# Patient Record
Sex: Female | Born: 1950 | Race: White | Hispanic: No | Marital: Married
Health system: Southern US, Community
[De-identification: ages and names within clinical notes are randomized; demographics above are authoritative.]

## PROBLEM LIST (undated history)

## (undated) DIAGNOSIS — C787 Secondary malignant neoplasm of liver and intrahepatic bile duct: Secondary | ICD-10-CM

## (undated) DIAGNOSIS — C7800 Secondary malignant neoplasm of unspecified lung: Secondary | ICD-10-CM

## (undated) DIAGNOSIS — N63 Unspecified lump in unspecified breast: Secondary | ICD-10-CM

## (undated) DIAGNOSIS — C801 Malignant (primary) neoplasm, unspecified: Secondary | ICD-10-CM

## (undated) DIAGNOSIS — F419 Anxiety disorder, unspecified: Secondary | ICD-10-CM

## (undated) DIAGNOSIS — C7931 Secondary malignant neoplasm of brain: Secondary | ICD-10-CM

---

## 1998-07-10 ENCOUNTER — Other Ambulatory Visit: Admission: RE | Admit: 1998-07-10 | Discharge: 1998-07-10 | Payer: Self-pay | Admitting: *Deleted

## 1999-07-11 ENCOUNTER — Other Ambulatory Visit: Admission: RE | Admit: 1999-07-11 | Discharge: 1999-07-11 | Payer: Self-pay | Admitting: *Deleted

## 2000-05-25 ENCOUNTER — Other Ambulatory Visit: Admission: RE | Admit: 2000-05-25 | Discharge: 2000-05-25 | Payer: Self-pay | Admitting: *Deleted

## 2001-02-01 ENCOUNTER — Other Ambulatory Visit: Admission: RE | Admit: 2001-02-01 | Discharge: 2001-02-01 | Payer: Self-pay | Admitting: Dermatology

## 2001-04-20 ENCOUNTER — Other Ambulatory Visit: Admission: RE | Admit: 2001-04-20 | Discharge: 2001-04-20 | Payer: Self-pay | Admitting: *Deleted

## 2002-01-10 ENCOUNTER — Encounter: Payer: Self-pay | Admitting: Family Medicine

## 2002-01-10 ENCOUNTER — Ambulatory Visit (HOSPITAL_COMMUNITY): Admission: RE | Admit: 2002-01-10 | Discharge: 2002-01-10 | Payer: Self-pay | Admitting: Internal Medicine

## 2002-02-01 ENCOUNTER — Other Ambulatory Visit: Admission: RE | Admit: 2002-02-01 | Discharge: 2002-02-01 | Payer: Self-pay | Admitting: Gynecology

## 2002-06-28 ENCOUNTER — Ambulatory Visit (HOSPITAL_COMMUNITY): Admission: RE | Admit: 2002-06-28 | Discharge: 2002-06-28 | Payer: Self-pay | Admitting: Family Medicine

## 2002-06-28 ENCOUNTER — Encounter: Payer: Self-pay | Admitting: Family Medicine

## 2003-03-22 ENCOUNTER — Other Ambulatory Visit: Admission: RE | Admit: 2003-03-22 | Discharge: 2003-03-22 | Payer: Self-pay | Admitting: Gynecology

## 2003-12-18 ENCOUNTER — Ambulatory Visit (HOSPITAL_COMMUNITY): Admission: RE | Admit: 2003-12-18 | Discharge: 2003-12-18 | Payer: Self-pay | Admitting: Internal Medicine

## 2004-03-22 ENCOUNTER — Other Ambulatory Visit: Admission: RE | Admit: 2004-03-22 | Discharge: 2004-03-22 | Payer: Self-pay | Admitting: Gynecology

## 2004-06-10 ENCOUNTER — Ambulatory Visit (HOSPITAL_COMMUNITY): Admission: RE | Admit: 2004-06-10 | Discharge: 2004-06-10 | Payer: Self-pay | Admitting: Family Medicine

## 2005-04-15 ENCOUNTER — Other Ambulatory Visit: Admission: RE | Admit: 2005-04-15 | Discharge: 2005-04-15 | Payer: Self-pay | Admitting: Gynecology

## 2005-06-05 ENCOUNTER — Ambulatory Visit: Payer: Self-pay | Admitting: Internal Medicine

## 2005-06-11 ENCOUNTER — Ambulatory Visit: Payer: Self-pay | Admitting: Internal Medicine

## 2005-06-11 ENCOUNTER — Ambulatory Visit (HOSPITAL_COMMUNITY): Admission: RE | Admit: 2005-06-11 | Discharge: 2005-06-11 | Payer: Self-pay | Admitting: Internal Medicine

## 2005-07-24 ENCOUNTER — Ambulatory Visit: Payer: Self-pay | Admitting: Internal Medicine

## 2005-08-01 ENCOUNTER — Ambulatory Visit (HOSPITAL_COMMUNITY): Admission: RE | Admit: 2005-08-01 | Discharge: 2005-08-01 | Payer: Self-pay | Admitting: Family Medicine

## 2005-12-16 ENCOUNTER — Ambulatory Visit: Payer: Self-pay | Admitting: Internal Medicine

## 2005-12-16 ENCOUNTER — Ambulatory Visit (HOSPITAL_COMMUNITY): Admission: RE | Admit: 2005-12-16 | Discharge: 2005-12-16 | Payer: Self-pay | Admitting: Internal Medicine

## 2006-05-13 ENCOUNTER — Other Ambulatory Visit: Admission: RE | Admit: 2006-05-13 | Discharge: 2006-05-13 | Payer: Self-pay | Admitting: Gynecology

## 2007-06-09 ENCOUNTER — Other Ambulatory Visit: Admission: RE | Admit: 2007-06-09 | Discharge: 2007-06-09 | Payer: Self-pay | Admitting: Gynecology

## 2009-01-16 ENCOUNTER — Encounter (INDEPENDENT_AMBULATORY_CARE_PROVIDER_SITE_OTHER): Payer: Self-pay | Admitting: Gynecology

## 2009-01-16 ENCOUNTER — Ambulatory Visit (HOSPITAL_BASED_OUTPATIENT_CLINIC_OR_DEPARTMENT_OTHER): Admission: RE | Admit: 2009-01-16 | Discharge: 2009-01-16 | Payer: Self-pay | Admitting: Gynecology

## 2009-02-06 ENCOUNTER — Encounter: Payer: Self-pay | Admitting: Gastroenterology

## 2010-06-20 ENCOUNTER — Ambulatory Visit (HOSPITAL_BASED_OUTPATIENT_CLINIC_OR_DEPARTMENT_OTHER)
Admission: RE | Admit: 2010-06-20 | Discharge: 2010-06-20 | Payer: Self-pay | Source: Home / Self Care | Admitting: Gynecology

## 2010-12-02 ENCOUNTER — Encounter: Payer: Self-pay | Admitting: Gastroenterology

## 2010-12-02 ENCOUNTER — Ambulatory Visit (INDEPENDENT_AMBULATORY_CARE_PROVIDER_SITE_OTHER): Payer: PRIVATE HEALTH INSURANCE | Admitting: Gastroenterology

## 2010-12-02 DIAGNOSIS — K589 Irritable bowel syndrome without diarrhea: Secondary | ICD-10-CM | POA: Insufficient documentation

## 2010-12-10 NOTE — Assessment & Plan Note (Signed)
Summary: PT WANTS REPEAT TCS/HAS HAD A HX OF CA IN HER VULVA SINCE HER...   Vital Signs:  Patient profile:   60 year old female Height:      61 inches Weight:      150 pounds BMI:     28.44 Temp:     98.5 degrees F oral Pulse rate:   72 / minute BP sitting:   132 / 86  (left arm)  Vitals Entered By: Carolan Clines LPN (December 02, 5407 1:29 PM)  Visit Type:  Initial Visit Primary Care Telly Broberg:  Dr. Collins Scotland   History of Present Illness: Pt presents today for possible colonoscopy. Last colonoscopy in March 2005 by Dr. Marlowe Alt canal hemorrhoids. FH of colon ca: uncle, diagnosed age 52. 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 Seen at [facility] for treatment; underwent surgery Oct 2011. Did not require chemo or radiation. Here today concerned and wants to have sooner colonoscopy. Due for repeat in 2015. Has hx of IBS-D; took align a few years ago, which kept her regular. After this diagnosis, has been extremely stressed. Has gotten back on align, now less postprandial urgency and loose stools. Consistency is improving. +brbpr for past few months intermittently. No loss of appetite.    Current Medications (verified): 1)  Pantoprazole Sodium 40 Mg Tbec (Pantoprazole Sodium) .... Take One Once Daily 2)  Vytorin 10-80 Mg Tabs (Ezetimibe-Simvastatin) .... 1/2 Tab Once Daily 3)  Diovan 80 Mg Tabs (Valsartan) .... Take One Once Daily 4)  Celexa 20 Mg Tabs (Citalopram Hydrobromide) .... Take One Half Once Daily 5)  Vivelle-Dot 0.1 Mg/24hr Pttw (Estradiol) .... Take 1/2 Twice Per Week 6)  Prometrium 200 Mg Caps (Progesterone Micronized) .... Take 12 Days Every Three Months 7)  Vitamin D3 400 Unit Tabs (Cholecalciferol) .... Take Two Once Daily 8)  Fish Oil 1000 Mg Caps (Omega-3 Fatty Acids) .... Take Two Once Daily  Allergies (verified): No Known Drug Allergies  Past History:  Past Medical History: Vulva melanoma HTN GERD  Past Surgical History: vulva melanoma  removal cholecystectomy 30 years ago tubal ligation   Family History: Mother: living age 47, HTN Father: deceased, HTN, Alzheimer's, CLL Siblings: 2 brothers: HTN, IBS Paternal uncle: colon ca in 36s, deceased  Social History: Occupation: [employer] manager. Patient currently smokes. 1/2 ppd X 30 years Alcohol Use - no Illicit Drug Use - no Smoking Status:  current Drug Use:  no  Review of Systems General:  Denies fever, chills, and anorexia. Eyes:  Denies blurring, irritation, and discharge. ENT:  Denies sore throat, hoarseness, and difficulty swallowing. CV:  Denies chest pains and syncope. Resp:  Denies dyspnea at rest and wheezing. GI:  See HPI. GU:  Denies urinary burning and urinary frequency. MS:  Denies joint pain / LOM, joint swelling, and joint stiffness. Derm:  Denies rash, itching, and dry skin. Neuro:  Denies weakness and syncope. Psych:  Denies depression and anxiety. Endo:  Denies cold intolerance and heat intolerance.  Physical Exam  General:  Well developed, well nourished, no acute distress. Head:  Normocephalic and atraumatic. Eyes:  PERRLA, no icterus. Lungs:  Clear throughout to auscultation. Heart:  Regular rate and rhythm; no murmurs, rubs,  or bruits. Abdomen:  +BS, soft, non-tender, non-distended. no HSM. No rebound or guarding.  Msk:  Symmetrical with no gross deformities. Normal posture. Pulses:  Normal pulses noted. Extremities:  No clubbing, cyanosis, edema or deformities noted. Neurologic:  Alert and  oriented x4;  grossly normal neurologically. Skin:  Intact without significant lesions or rashes. Psych:  Alert and cooperative. Normal mood and affect.   Impression & Recommendations:  Problem # 1:  IRRITABLE BOWEL SYNDROME (ICD-13.76)  60 year old pleasant Caucasian female with hx of IBS, diarrhea predominant. Recently diagnosed in Sept 2011 with vulva melanoma, s/p removal in October 2011. Has +FH colon ca (paternal  uncle in 31s). Stool consistency improving since restarting Align; has done well with this in the past. Under a lot of stress secondary to diagnosis. +brbpr intermittently for past few months. Although next colonoscopy due in 2015, would like sooner appt due to recent diagnosis. Most likely low-volume hematochezia secondary to benign anorectal source; diagnosis of IBS likely contributor to bowel habits.  TCS with Dr. Jena Gauss in near future: the R/B/A have been discussed in detail; pt states understanding and desires to proceed. Continue Align  Orders: New Patient Level III [phone])   Orders Added: 1)  New Patient Level III [98119]

## 2010-12-12 LAB — POCT I-STAT 4, (NA,K, GLUC, HGB,HCT)
Glucose, Bld: 104 mg/dL — ABNORMAL HIGH (ref 70–99)
HCT: 42 % (ref 36.0–46.0)
Hemoglobin: 14.3 g/dL (ref 12.0–15.0)
Potassium: 4 mEq/L (ref 3.5–5.1)
Sodium: 138 mEq/L (ref 135–145)

## 2010-12-23 ENCOUNTER — Encounter: Payer: PRIVATE HEALTH INSURANCE | Admitting: Internal Medicine

## 2010-12-23 ENCOUNTER — Ambulatory Visit (HOSPITAL_COMMUNITY)
Admission: RE | Admit: 2010-12-23 | Discharge: 2010-12-23 | Disposition: A | Discharge status: Home / Self Care | Payer: PRIVATE HEALTH INSURANCE | Source: Ambulatory Visit | Attending: Internal Medicine | Admitting: Internal Medicine

## 2010-12-23 DIAGNOSIS — K648 Other hemorrhoids: Secondary | ICD-10-CM

## 2010-12-23 DIAGNOSIS — E785 Hyperlipidemia, unspecified: Secondary | ICD-10-CM | POA: Insufficient documentation

## 2010-12-23 DIAGNOSIS — I10 Essential (primary) hypertension: Secondary | ICD-10-CM | POA: Insufficient documentation

## 2010-12-23 DIAGNOSIS — K625 Hemorrhage of anus and rectum: Secondary | ICD-10-CM

## 2010-12-23 DIAGNOSIS — Z79899 Other long term (current) drug therapy: Secondary | ICD-10-CM | POA: Insufficient documentation

## 2010-12-24 NOTE — Op Note (Signed)
  Patricia [name], Patricia [name]              ACCOUNT NO.:  [id number]  MEDICAL RECORD NO.:  [id number]           PATIENT TYPE:  O  LOCATION:  DAYP                          FACILITY:  [facility]  PHYSICIAN:  R. Roetta Sessions, M.D. DATE OF BIRTH:  12-28-1950  DATE OF PROCEDURE:  12/23/2010 DATE OF DISCHARGE:                              OPERATIVE REPORT   Ileocolonoscopy diagnostic.  INDICATIONS FOR PROCEDURE:  A 60 year old lady with couple of episodes of blood per rectum recently, history of a bulbar melanoma, last colonoscopy in 2005.  No family history any first-degree relatives with colon cancer.  Colonoscopy is now being done.  Risks, benefits, limitations, alternatives, and imponderables have been discussed, questions answered.  Please see the documentation for medical record.  PROCEDURE NOTE:  O2 saturation, blood pressure, pulse, and respirations were monitored throughout the entire procedure.  CONSCIOUS SEDATION:  Versed 5 mg IV, Demerol 125 mg IV in divided doses.  INSTRUMENT:  Pentax video chip system.  FINDINGS:  Digital rectal exam revealed no abnormalities.  Endoscopic findings, prep was adequate.  Colon:  Colonic mucosa was surveyed from the rectosigmoid junction through the left transverse, right colon, to the appendiceal orifice, ileocecal valve/cecum.  These structures were well seen and photographed for the record.  Terminal ileum was admitted to 10 cm.  From this level scope was slowly and cautiously withdrawn. All previously mentioned mucosal surfaces were again seen.  The colonic mucosa appeared normal as did the terminal ileum mucosa.  Scope was pulled down into the rectum with thorough examination of the rectal mucosa including retroflex view of the anal verge demonstrating anal papilla only.  The patient tolerated the procedure well.  Cecal withdrawal time  7 minutes.  IMPRESSION:  Internal hemorrhoids, anal papilla, otherwise normal rectum, colon, terminal ileum.   I suspect benign anorectal bleeding.  RECOMMENDATIONS: 1. Hemorrhoid literature provided to Ms. Lequita Halt.  Continue Align     probiotic 1 capsule daily. 2. Anusol-HC suppositories 1 per rectum at bedtime x10 days. 3. Family history of colon cancer in an uncle but in advanced age.     This is secondary relative colon cancer advance age which has not     put her on a high risk category.  RECOMMENDATIONS:  Consider repeat screening colonoscopy in 10 years.     Jonathon Bellows, M.D.     RMR/MEDQ  D:  12/23/2010  T:  12/23/2010  Job:  [id number]  cc:   De Blanch, M.D.  Dr. Collins Scotland [facility]  Electronically Signed by Lorrin Goodell M.D. on 12/24/2010 11:26:04 AM

## 2011-01-08 LAB — POCT I-STAT, CHEM 8
BUN: 14 mg/dL (ref 6–23)
Calcium, Ion: 1.19 mmol/L (ref 1.12–1.32)
Chloride: 104 mEq/L (ref 96–112)
Creatinine, Ser: 0.5 mg/dL (ref 0.4–1.2)
Glucose, Bld: 108 mg/dL — ABNORMAL HIGH (ref 70–99)
HCT: 43 % (ref 36.0–46.0)
Hemoglobin: 14.6 g/dL (ref 12.0–15.0)
Potassium: 4 mEq/L (ref 3.5–5.1)
Sodium: 140 mEq/L (ref 135–145)
TCO2: 24 mmol/L (ref 0–100)

## 2011-02-11 NOTE — Op Note (Signed)
Patricia [name], Patricia [name] NO.:  [id number]   MEDICAL RECORD NO.:  [id number]          PATIENT TYPE:  AMB   LOCATION:  NESC                         FACILITY:  [facility]   PHYSICIAN:  Gretta Cool, M.D. DATE OF BIRTH:  [birth year]   DATE OF PROCEDURE:  01/16/2009  DATE OF DISCHARGE:                               OPERATIVE REPORT   PREOPERATIVE DIAGNOSIS:  Abnormal uterine bleeding, recurrent with  history of dilatation and curettage x3, unresponsive to conservative  therapy.   PROCEDURES:  1. Hysteroscopy.  2. Resection of her endometrium.  3. Total resection of endometrial polyps and ablation with VaporTrode.   SURGEON:  Gretta Cool, M.D.   ANESTHESIA:  IV sedation and paracervical block.   DESCRIPTION OF PROCEDURE:  Under excellent anesthesia as above, with the  patient prepped and draped in Allen stirrups, with her bladder drained.  The cervix was grasped with a single-tooth tenaculum, pulled down into  view.  It was progressively dilated with a series of Pratt dilators to  accommodate the 7 mm resectoscope.  The cavity was photographed with  evidence of endometrial polyps.  The polyps were resected first the  entire endometrium was then resected and submitted for pathologic  examination.  At this point the VaporTrode electrode was applied and the  entire cavity treated 360 degrees with VaporTrode to eliminate any  superficial endometrium.  The cornual areas were treated by touch  technique.  At this point the procedure was terminated without  complication.  The patient returned to the recovery room in excellent  condition.           ______________________________  Gretta Cool, M.D.     CWL/MEDQ  D:  01/16/2009  T:  01/16/2009  Job:  [id number]   cc:   Dr. Yehuda Budd

## 2011-02-14 NOTE — Op Note (Signed)
[name], [name]              ACCOUNT NO.:  [id number]   MEDICAL RECORD NO.:  [id number]          PATIENT TYPE:  AMB   LOCATION:  DAY                           FACILITY:  [facility]   PHYSICIAN:  R. Roetta Sessions, M.D. DATE OF BIRTH:  07-Nov-1950   DATE OF PROCEDURE:  12/16/2005  DATE OF DISCHARGE:                                 OPERATIVE REPORT   PROCEDURE:  Follow-up esophagogastroduodenoscopy.   INDICATIONS FOR PROCEDURE:  The patient is a 60 year old lady who was found  to have a gastric ulcer last year, nonsteroidal drug induced. She has been  treated for Helicobacter pylori previously. She has put off on followup EGD  until now. She has done well on Aciphex 20 mg orally daily. EGD is now being  done as a surveillance maneuver. This approach has been discussed with the  patient at length. Potential risks, benefits, and alternatives have been  reviewed and questions answered. Please see documentation in the medical  record.   PROCEDURE NOTE:  O2 saturation, blood pressure, pulse, and respirations were  monitored throughout the entire procedure. Conscious sedation with Versed 4  mg IV and Demerol 75 mg IV in divided doses.   INSTRUMENT:  Olympus video chip system.   FINDINGS:  Examination of the tubular esophagus revealed no mucosal  abnormalities. EG junction was easily traversed.   Stomach:  Gastric cavity was empty and insufflated well with air. Thorough  examination of gastric mucosa including retroflexed view of the proximal  stomach and esophagogastric junction was undertaken. The previously noted  prepyloric ulcer was completely healed. There was no residual evidence of  the ulcer. Pylorus patent and easily traversed. Examination of bulb and  second portion revealed a couple of bulbar erosions. Otherwise, D1 and D2  appeared normal. The patient tolerated the procedure well and was reactive  to endoscopy.   IMPRESSION:  Normal esophagus, stomach. Bulbar erosions.  Otherwise, D1 and  D2 appeared normal. Previously noted gastric ulcer has completely healed.   RECOMMENDATIONS:  Ms. Sek should stay away from nonsteroidal agents. If  she does so, she should do very well over long haul. No further GI  evaluation warranted.      Jonathon Bellows, M.D.  Electronically Signed     RMR/MEDQ  D:  12/16/2005  T:  12/17/2005  Job:  [id number]   cc:   Madelin Rear. Sherwood Gambler, MD  Fax: [phone]

## 2011-02-14 NOTE — Op Note (Signed)
Patricia [name], Patricia [name]              ACCOUNT NO.:  [id number]   MEDICAL RECORD NO.:  [id number]          PATIENT TYPE:  AMB   LOCATION:  DAY                           FACILITY:  [facility]   PHYSICIAN:  R. Roetta Sessions, M.D. DATE OF BIRTH:  Jan 28, 1951   DATE OF PROCEDURE:  06/11/2005  DATE OF DISCHARGE:                                 OPERATIVE REPORT   The patient is a 60 year old lady with nonspecific upper abdominal pain and  some reflux symptoms recently who takes a number of over-the-counter aspirin  products for headaches and now is undergoing EGD. She tells me since she was  seen recently she has had resolution of her symptoms. She is not currently  on acid-suppression therapy. She has been treated x2 for Helicobacter pylori  infection based on serologies. Potential risks, benefits, and alternatives  have been discussed with patient. Questions answered. She is agreeable.  Please see documentation in the medical record.   PROCEDURE NOTE:  O2 saturation, blood pressure, pulse, and respirations were  monitored throughout the entire procedure. Conscious sedation with Versed 6  mg IV and Demerol 125 mg IV in divided doses.   INSTRUMENT:  Olympus video chip system.   FINDINGS:  Examination of the tubular esophagus revealed no mucosa  abnormalities. EG junction easily traversed.   Stomach:  Gastric cavity was empty and insufflated well with air. Thorough  examination of gastric mucosa including retroflexed view of the proximal  stomach and esophagogastric junction demonstrated a couple of areas of  linear erosions in the antrum. There was a 2-mm prepyloric antral ulcer.  This was small but had respective depth. There is no obvious infiltrating  process. Pylorus was patent and easily traversed. Examination of bulb and  second portion revealed multiple erosions in the bulb. Otherwise, D1 and D2  appeared normal.   THERAPEUTIC/DIAGNOSTIC MANEUVERS:  None.   The patient tolerated the  procedure well and was reactive to endoscopy.   IMPRESSION:  1.  Normal esophagus.  2.  Small prepyloric antral ulcer.  3.  Antral erosions, otherwise normal stomach.  4.  Bulbar erosions, otherwise D1 and D2 appear normal.   I suspect her symptoms and today's findings are secondary to NSAID use. She  has been treated for Helicobacter pylori x2 previously.   RECOMMENDATIONS:  1.  Absolutely refrain from taking all aspirin including baby aspirin which      she takes daily.  2.  Course of Aciphex 20 mg orally daily.  3.  We will plan to see this nice lady back in six weeks. We will decide      about further evaluation at that time.      Jonathon Bellows, M.D.  Electronically Signed     RMR/MEDQ  D:  06/11/2005  T:  06/11/2005  Job:  [id number]   cc:   [name], M.D.  [address]  [place]  [place] [address]  Fax: [phone]

## 2011-02-14 NOTE — Op Note (Signed)
NAME:  Patricia [name], Patricia [name]                        ACCOUNT NO.:  [id number]   MEDICAL RECORD NO.:  [id number]                   PATIENT TYPE:  AMB   LOCATION:  DAY                                  FACILITY:  [facility]   PHYSICIAN:  R. Roetta Sessions, M.D.              DATE OF BIRTH:  Nov 11, 1950   DATE OF PROCEDURE:  12/18/2003  DATE OF DISCHARGE:                                 OPERATIVE REPORT   PROCEDURE:  Screening colonoscopy.   INDICATIONS FOR PROCEDURE:  The patient is a 59 year old sent over by the  courtesy of Dr. Nicholas Lose down in [place] for colorectal cancer screening.  Aside from having intermittent postprandial abdominal cramps and diarrhea  which she has had for years, she is devoid of any lower GI tract symptoms.  There is no family history of colorectal neoplasia.  She has never had her  colon imaged previously.  She is now at threshold age for colonoscopy, and  colonoscopy is consequently being done for colorectal cancer screening  purposes.  This approach has been discussed with the patient at length at  the bedside.  The potential risks, benefits, and alternatives have been  reviewed and questions answered.  Please see my handwritten H&P for more  information.   PROCEDURE:  O2 saturation, blood pressure, pulses, and respirations were  monitored throughout the entirety of the procedure.  Conscious sedation was  with Versed 3 mg IV, Demerol 75 mg IV in divided doses.  The instrument used  was the Olympus pediatric colonoscope.   FINDINGS:  Digital rectal examination revealed no abnormalities.   ENDOSCOPIC FINDINGS:  The prep was good.   Rectum:  Examination of the rectal mucosa including retroflex view of the  anal verge and en face view of the anal canal revealed only minimal  internal/anal canal hemorrhoids.  The rectal mucosa otherwise appeared  normal.   Colon:  The colonic mucosa was surveyed from the rectosigmoid junction  through the left, transverse, right colon  to the area of the appendiceal  orifice, ileocecal valve, and cecum.  These structures were well-seen and  photographed for the record.  From this level, the scope was slowly  withdrawn.  All previously mentioned mucosal surfaces were again seen. The  only abnormality noted was a couple of areas of subtle submucosal petechiae  in the sigmoid segment of uncertain significance.  Otherwise, the colonic  mucosa appeared entirely normal.  The patient tolerated the procedure well  and was reactive in endoscopy.   IMPRESSION:  1. Anal canal hemorrhoids.  Otherwise normal rectum.  2. Minimal submucosal petechiae in the sigmoid mucosa of uncertain     significance.  The remainder of the colonic mucosa appeared normal.  3. Clinically, the patient has diarrhea-predominant irritable bowel     syndrome.   RECOMMENDATIONS:  1. Irritable bowel syndrome literature provided to Ms. Lequita Halt.  2. Prescription for NuLev one tablet on the tongue before  meals and at     bedtime as needed, #30.  3. Follow up with me on a p.r.n. basis.  4. Repeat colonoscopy in 10 years.      ___________________________________________                                            Jonathon Bellows, M.D.   RMR/MEDQ  D:  12/18/2003  T:  12/18/2003  Job:  [id number]   cc:   Gretta Cool, M.D.  [address] [place]  [place] [address]  Fax: [phone]   Madelin Rear. Sherwood Gambler, M.D.  [address]  [place]  [place] [address]  Fax: [phone]

## 2011-02-14 NOTE — Consult Note (Signed)
NAMELYFE, REIHL              ACCOUNT NO.:  [id number]   MEDICAL RECORD NO.:  [id number]           PATIENT TYPE:  AMB   LOCATION:                                FACILITY:  [facility]   PHYSICIAN:  R. Roetta Sessions, M.D. DATE OF BIRTH:  August 13, 1951   DATE OF CONSULTATION:  06/05/2005  DATE OF DISCHARGE:                                   CONSULTATION   REASON FOR CONSULTATION:  Upper abdominal discomfort.   [name] is a pleasant 60 year old Caucasian female sent over at  the courtesy of Dr. [name] to further evaluate a several month  history of a band-like upper abdominal discomfort radiating up into her  chest from time to time, sometimes into her back. She had similar symptoms  three years ago. Apparently, Helicobacter pylori serologies came back  positive at that time, and she was given triple drug therapy with resolution  of her  symptoms until the past couple of months. Again, she was retreated  for Helicobacter pylori recently and has had a significant amelioration in  her symptoms. She does take an aspirin daily and takes Excedrin and Goody's  Powders sporadically monthly for headaches. She has not had any melena or  rectal bleeding. She has a history of diarrhea-predominant IBS, but since  she started taking Vytorin for her cholesterol, bowel function has  normalized, having one to two formed bowel movements daily. She had a  screening colonoscopy December 18, 2003 which demonstrated only anal canal  hemorrhoids and a couple of a submucosa petechiae in the sigmoid colon.   She denies early satiety. No odynophagia, dysphagia. She does have  occasional reflux symptoms. She was on Aciphex 20 mg orally b.i.d. more  recently but came off that medication entirely one week ago and continues to  do well at least up until this point. She describes significant stress in  her life and wonders if this could be contributing to some of her symptoms.  She has not lost any weight.  She has been gaining weight. Her gallbladder  was removed 23 years ago. Apparently, she did have stones. She has not had  any GI imaging studies recently.   PAST MEDICAL HISTORY:  1.  Significant for hyperlipidemia.  2.  Hypertension.   PAST SURGICAL HISTORY:  1.  Colonoscopy in 2005.  2.  Cholecystectomy 23 years ago.   CURRENT MEDICATIONS:  1.  Vytorin 10/20 one half tablet daily.  2.  Vivelle-Dot 0.05 two times weekly.  3.  Diovan 80 mg daily.  4.  Prometrium 200 mg 14 days out of the month.  5.  Caltrate daily.  6.  Aspirin 81 mg daily.  7.  Xanax 0.25 mg t.i.d. p.r.n.   ALLERGIES:  No known drug allergies.   FAMILY HISTORY:  Negative for chronic GI or liver disease, although father  may have had stomach ulcers.   SOCIAL HISTORY:  The patient is married. She smokes a half pack of  cigarettes per day. 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 She does not use any alcohol.  REVIEW OF SYSTEMS:  No chest pain or dyspnea on exertion. Weight gain as  outlined above. GI review of systems as above.   PHYSICAL EXAMINATION:  GENERAL:  Reveals a pleasant, 60 year old lady  resting comfortably.  VITAL SIGNS:  Weight 146.5, height 5 foot 2. Temperature 98.1, BP 122/60,  pulse 74.  SKIN:  Warm and dry. No jaundice. No continuous stigmata of chronic liver  disease.  HEENT:  No scleral icterus. JVD is not prominent.  CHEST:  Lungs are clear to auscultation.  BREASTS:  Deferred.  ABDOMEN:  Flat, positive bowel sounds, soft, nontender without appreciable  mass or organomegaly.  EXTREMITIES:  No edema.   LABORATORY DATA:  From May 19, 2005, labs from Dr. Geanie Logan office, CBC  basically normal. LFTs normal. Amylase and lipase normal.   IMPRESSION:  [name] is a 60 year old lady with nonspecific upper  abdominal pain and some reflux symptoms recently. She gives a history of  what sounds like being treated for Helicobacter pylori based  on positive  serologies three years ago with resolution of her symptoms, and more  recently, she has had recurrence. She does take nonsteroidals sporadically.  This may be contributing to her symptoms. I do not get a sense she has any  ominous occult pathology such as lymphoma or gastric neoplasm. She certainly  could have peptic ulcer disease, sphincter of Oddi dysfunction, non-ulcer  dyspepsia, and unbridled gastroesophageal reflux disease would remain on the  differential at this time. I feel the most practical approach would be to go  ahead and proceed with an EGD to directly survey the mucosal surface of this  nice lady's upper GI tract. Potential risks, benefits, and alternatives have  been reviewed and questions answered. She is agreeable. We will plan to  perform the EGD in the very near future at [facility] and then will  render further recommendations.   I would like to thank Dr. [name] for allowing me to see this nice  lady today.      Jonathon Bellows, M.D.  Electronically Signed     RMR/MEDQ  D:  06/05/2005  T:  06/05/2005  Job:  [id number]   cc:   [name], M.D.  [address]  [place]  [place] [address]  Fax: [phone]

## 2011-07-07 ENCOUNTER — Other Ambulatory Visit: Payer: Self-pay | Admitting: Gynecology

## 2011-07-18 ENCOUNTER — Other Ambulatory Visit: Payer: Self-pay

## 2011-07-18 MED ORDER — RABEPRAZOLE SODIUM 20 MG PO TBEC: 20.0000 mg | DELAYED_RELEASE_TABLET | Freq: Every day | ORAL | Status: DC

## 2011-07-18 NOTE — Telephone Encounter (Signed)
Per our last records, pt was on protonix 40mg  daily, not Aciphex. Please verify med & let me know. Thanks

## 2011-07-18 NOTE — Telephone Encounter (Signed)
OK 

## 2011-07-18 NOTE — Telephone Encounter (Signed)
The drug store changed her to Protonix 40 mg because it was cheaper but then they changed to a different drug rep. And now it is breaking her out and she would like to go back on the Aciphex

## 2012-01-28 ENCOUNTER — Other Ambulatory Visit: Payer: Self-pay | Admitting: Internal Medicine

## 2012-06-09 ENCOUNTER — Other Ambulatory Visit: Payer: Self-pay | Admitting: Gynecology

## 2013-01-06 ENCOUNTER — Ambulatory Visit (HOSPITAL_COMMUNITY)
Admission: RE | Admit: 2013-01-06 | Discharge: 2013-01-06 | Disposition: A | Discharge status: Home / Self Care | Payer: 59 | Source: Ambulatory Visit | Attending: Rehabilitation | Admitting: Rehabilitation

## 2013-01-06 DIAGNOSIS — G3184 Mild cognitive impairment, so stated: Secondary | ICD-10-CM | POA: Insufficient documentation

## 2013-01-06 DIAGNOSIS — M6281 Muscle weakness (generalized): Secondary | ICD-10-CM | POA: Insufficient documentation

## 2013-01-06 DIAGNOSIS — R42 Dizziness and giddiness: Secondary | ICD-10-CM | POA: Insufficient documentation

## 2013-01-06 DIAGNOSIS — R6889 Other general symptoms and signs: Secondary | ICD-10-CM | POA: Insufficient documentation

## 2013-01-06 DIAGNOSIS — Z5189 Encounter for other specified aftercare: Secondary | ICD-10-CM | POA: Insufficient documentation

## 2013-01-06 DIAGNOSIS — C714 Malignant neoplasm of occipital lobe: Secondary | ICD-10-CM | POA: Insufficient documentation

## 2013-01-06 DIAGNOSIS — C716 Malignant neoplasm of cerebellum: Secondary | ICD-10-CM | POA: Insufficient documentation

## 2013-01-06 NOTE — Evaluation (Signed)
Speech Language Pathology Evaluation Patient Details  Name: Patricia [name] MRN: [id number] Date of Birth: [birth year]  Today's Date: 01/06/2013 Time: 1015-1100 SLP Time Calculation (min): 45 min  Authorization: Generic Commercial  Authorization Time Period:    Authorization Visit#:   of     Past Medical History: No past medical history on file. 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 : Yes Is the patient reluctant to leave their home because of a fear of falling? : Yes  Cognition  Overall Cognitive Status: Impaired Arousal/Alertness: Awake/alert Orientation Level: Oriented X4 Attention: Divided;Alternating Alternating Attention: Impaired Alternating Attention Impairment: Verbal complex;Functional complex Divided Attention: Impaired Divided Attention Impairment: Functional complex;Verbal complex Memory: Appears intact Awareness: Impaired Awareness Impairment: Anticipatory impairment Problem Solving: Appears intact Executive Function: Organizing Behaviors: Other (comment) (dizzy, lays head on table and closes eyes) Safety/Judgment: Appears intact (although suspect impulsivity)  Comprehension  Auditory Comprehension Overall Auditory Comprehension: Appears within functional limits for tasks assessed Yes/No Questions: Within Functional Limits Commands: Within Functional Limits Visual  Recognition/Discrimination Discrimination: Within Function Limits Reading Comprehension Reading Status: Not tested  Expression  Expression Primary Mode of Expression: Verbal Verbal Expression Overall Verbal Expression: Appears within functional limits for tasks assessed Initiation: No impairment Level of Generative/Spontaneous Verbalization: Conversation Repetition: No impairment Naming: No impairment Pragmatics: Impairment Impairments: Topic maintenance;Other (comment);Turn Taking (slight impulsivity, interrupting) Interfering Components: Attention Non-Verbal Means of Communication: Not applicable Written Expression Written Expression: Not tested  Oral/Motor  Oral Motor/Sensory Function Overall Oral Motor/Sensory Function: Appears within functional limits for tasks assessed Motor Speech Overall Motor Speech: Appears within functional limits for tasks assessed  SLP Goals  Home Exercise SLP Goal: Patient will Perform Home Exercise Program: Independently SLP Short Term Goals SLP Short Term Goal 1: Pt will utilize memory strategies in functional complex tasks for 95% recall and min assist. SLP Short Term Goal 2: Pt will complete moderately complex thought organization tasks with 90% acc and min assist. SLP Short Term Goal 3: Pt will complete functional alternating and divided attention tasks in therapy setting with 90% acc and min assist. SLP Long Term Goals SLP Long Term Goal 1: Improve overall cognitive/executive function skills to [facility] for home and community environment with use of strategies as needed.  Assessment/Plan  Patient Active Problem List  Diagnosis  . IRRITABLE BOWEL SYNDROME   SLP - End of Session Activity Tolerance: Patient tolerated treatment well;Patient limited by fatigue (pt rested with head down due to dizziness) General Behavior During Session: [facility] for tasks performed Cognition: [facility] for tasks performed  SLP Assessment/Plan Clinical Impression  Statement: Mrs. Patricia [name] is a 62 yo female with a history of melanoma and HTN. She recently underwent chemotherapy when she experienced sudden nausea, dizziness, and vomitting. MRI showed CA on brain. She underwent emergency surgery on March 23 with suboccipital craniectomy due to cerebellar metastatic disease. Pt apparently also had a shunt to relieve pressure and will undergo additional radiation therapy next week for other spots in the brain. She stayed in the ICU for 8 days before going to inpatient  rehab at [facility]. She was discharged home on 01/04/13. Pt presents with very mild higher level cognitive dysfunction characterized by impulsivity, decreased attention to detail, and decreased thought organization. Her family insists that she is at her baseline except is bothered by fatigue and dizziness in terms of cognitive linguistic functioning. Pt would benefit from short term cognitve therapy to address mild deficits and increase independence with complex executive functioning tasks. Speech Therapy Frequency: min 1 x/week Duration: 2 weeks Treatment/Interventions: Cognitive reorganization;Compensatory strategies;Internal/external aids;Patient/family education;SLP instruction and feedback Potential to Achieve Goals: Good Potential Considerations:  (dizziness, ongoing radiation therapy to brain)  Thank you,  Patricia [name], CCC-SLP [phone]      Patricia [name] 01/06/2013, 11:47 AM  Physician Documentation Your signature is required to indicate approval of the treatment plan as stated above.  Please sign and either send electronically or make a copy of this report for your files and return this physician signed original.  Please mark one 1.__approve of plan  2. ___approve of plan with the following conditions.   ______________________________                                                          _____________________ Physician Signature                                                                                                              Date

## 2013-01-06 NOTE — Evaluation (Signed)
Occupational Therapy Evaluation  Patient Details  Name: Patricia [name] MRN: [id number] Date of Birth: May 12, 1951  Today's Date: 01/06/2013 Time: 1100-1130 OT Time Calculation (min): 30 min OT Evaluation 30' Visit#: 1 of 2  Re-eval: 01/13/13  Assessment Diagnosis: S/P Melanoma of Cerebellum   Past Medical History: No past medical history on file. Past Surgical History: No past surgical history on file.  Subjective Symptoms/Limitations Symptoms: S:  I think my only real deficit is my dizziness, which is from the melanoma.  The MD said it should be better in a month or so.   Pertinent History: Mrs. Rossbach was hospitalized for 16 days after having a melanoma in her cerebellum removed.  She was in inpatient rehab for just over a week, and has been referred to occupational therapy for evaluation and treatment.   Patient Stated Goals: I want to go home and take a bath. Pain Assessment Currently in Pain?: No/denies  Precautions/Restrictions  Precautions Precautions: Fall  Balance Screening Balance Screen Has the patient fallen in the past 6 months: No Has the patient had a decrease in activity level because of a fear of falling? : Yes Is the patient reluctant to leave their home because of a fear of falling? : Yes  Prior Functioning  Home Living Lives With: Spouse Available Help at Discharge: Friend(s);Family Type of Home: House Prior Function Vocation: Part time employment Comments: enjoys reading, spending time with grandchildren  Assessment ADL/Vision/Perception ADL ADL Comments: Patient is at SBA level for safety with all BADLs.  She has assistance with shampooing to avoid damaging her surgical incision.  She has not cleaned, but would like to get back to doing so.   Dominant Hand: Right Vision - History Patient Visual Report: Blurring of vision Vision - Assessment Eye Alignment: Within Functional Limits  Cognition/Observation Cognition Overall Cognitive Status:  Impaired Arousal/Alertness: Awake/alert Orientation Level: Oriented X4 Attention: Divided;Alternating Alternating Attention: Impaired Alternating Attention Impairment: Verbal complex;Functional complex Divided Attention: Impaired Divided Attention Impairment: Functional complex;Verbal complex Memory: Appears intact Awareness: Impaired Awareness Impairment: Anticipatory impairment Problem Solving: Appears intact Executive Function: Organizing Behaviors: Other (comment) (dizzy, lays head on table and closes eyes) Safety/Judgment: Appears intact (although suspect impulsivity)  Sensation/Coordination/Edema Coordination Gross Motor Movements are Fluid and Coordinated: No Fine Motor Movements are Fluid and Coordinated: Yes Finger Nose Finger Test: slight impairment in left side  Additional Assessments RUE AROM (degrees) RUE Overall AROM Comments: BUE AROM is WNL RUE Strength RUE Overall Strength Comments: BUE strength is WNL. Grip (lbs): 65  (left 46) Lateral Pinch: 14 lbs (left 12) 3 Point Pinch: 14 lbs (left 13) Right Hand Strength - Pinch (lbs) Lateral Pinch: 14 lbs (left 12) 3 Point Pinch: 14 lbs (left 13)      Occupational Therapy Assessment and Plan OT Assessment and Plan Clinical Impression Statement: A:  Patient is s/p 2 week inpatient hospital stay due to cancer removal in cerebellum.  She presents at SBA level with BADLs due to increased dizziness.   Rehab Potential: Good OT Frequency: Min 1X/week OT Duration: 2 weeks OT Treatment/Interventions: Self-care/ADL training;Patient/family education OT Plan: P:  SKilled OT intervention to improve to mod I level with all BADLs, by increasing GMC, FMC, grip strength.  Treatment plan:  Follow up on HEP.  Reassess for any additonal concerns at home and dc if no additonal concerns present.   Goals Short Term Goals Time to Complete Short Term Goals: 2 weeks Short Term Goal 1: Patient will be educated on HEP. Short Term  Goal 2:  Patient will progress to modified independent with her daily activiteis.  Short Term Goal 3: Patient will increase left hand grip strength by 5 pounds for increased ability to open bottles of cleaning products. Short Term Goal 4: Patient will increase accurance of GMC activities to [facility] for increased safety during ADLs.   Problem List Patient Active Problem List  Diagnosis  . IRRITABLE BOWEL SYNDROME  . Cancer of cerebellum  . Other general symptoms    End of Session Activity Tolerance: Patient tolerated treatment well General Behavior During Session: [facility] for tasks performed Cognition: [facility] for tasks performed OT Plan of Care OT Home Exercise Plan: Grip strengthening with theraputty and gross motor coordination exercises.  OT Patient Instructions: handouts Consulted and Agree with Plan of Care: Patient  GO    Shirlean Mylar, OTR/L  01/06/2013, 3:06 PM  Physician Documentation Your signature is required to indicate approval of the treatment plan as stated above.  Please sign and either send electronically or make a copy of this report for your files and return this physician signed original.  Please mark one 1.__approve of plan  2. ___approve of plan with the following conditions.   ______________________________                                                          _____________________ Physician Signature                                                                                                             Date

## 2013-01-13 ENCOUNTER — Ambulatory Visit (HOSPITAL_COMMUNITY): Admission: RE | Admit: 2013-01-13 | Payer: 59 | Source: Ambulatory Visit

## 2013-01-13 ENCOUNTER — Ambulatory Visit (HOSPITAL_COMMUNITY): Payer: 59 | Admitting: Speech Pathology

## 2013-01-27 ENCOUNTER — Ambulatory Visit (HOSPITAL_COMMUNITY)
Admission: RE | Admit: 2013-01-27 | Discharge: 2013-01-27 | Disposition: A | Discharge status: Home / Self Care | Payer: 59 | Source: Ambulatory Visit | Attending: Rehabilitation | Admitting: Rehabilitation

## 2013-01-27 DIAGNOSIS — Z5189 Encounter for other specified aftercare: Secondary | ICD-10-CM | POA: Insufficient documentation

## 2013-01-27 DIAGNOSIS — C716 Malignant neoplasm of cerebellum: Secondary | ICD-10-CM

## 2013-01-27 DIAGNOSIS — R2689 Other abnormalities of gait and mobility: Secondary | ICD-10-CM

## 2013-01-27 DIAGNOSIS — G3184 Mild cognitive impairment, so stated: Secondary | ICD-10-CM | POA: Insufficient documentation

## 2013-01-27 DIAGNOSIS — R262 Difficulty in walking, not elsewhere classified: Secondary | ICD-10-CM

## 2013-01-27 DIAGNOSIS — R42 Dizziness and giddiness: Secondary | ICD-10-CM | POA: Insufficient documentation

## 2013-01-27 DIAGNOSIS — M6281 Muscle weakness (generalized): Secondary | ICD-10-CM | POA: Insufficient documentation

## 2013-01-27 DIAGNOSIS — C714 Malignant neoplasm of occipital lobe: Secondary | ICD-10-CM | POA: Insufficient documentation

## 2013-01-27 NOTE — Progress Notes (Signed)
  Patient Details  Name: Patricia [name] MRN: [id number] Date of Birth: 07/31/1951  Today's Date: 01/27/2013  Met with patient to follow-up with HEP and reassess any additional concerns at home since OT evaluation 3 weeks ago. Patient states that she is doing everything at home herself, she starting to use the walker more even though it's faster without it, and she is doing the exercises at home. Patient does not voice any additional concerns and will be discharged from OT services.   Limmie Patricia, OTR/L,CBIS   01/27/2013, 10:25 AM

## 2013-01-27 NOTE — Progress Notes (Signed)
Speech Language Pathology Treatment Patient Details  Name: Patricia [name] MRN: [id number] Date of Birth: 1951/07/12  Today's Date: 01/27/2013 Time: 0940-1017 SLP Time Calculation (min): 37 min  Authorization: Generic Commercial  Authorization Time Period:    Authorization Visit#:  1 of  6   HPI:  Symptoms/Limitations Symptoms: I'm doing better today, last time I was here I didn't feel well. Pain Assessment Currently in Pain?: No/denies  Assessments Prior Functional Status Type of Home: House Lives With: Spouse;Family Available Help at Discharge: Friend(s);Family Vocation: Part time employment  Treatment  Cognitive-Linguistic Therapy Patient/Family Education Home Exercise Program  SLP Goals  Home Exercise SLP Goal: Patient will Perform Home Exercise Program: Independently SLP Goal: Perform Home Exercise Program - Progress: Progressing toward goal SLP Short Term Goals SLP Short Term Goal 1: Pt will utilize memory strategies in functional complex tasks for 95% recall and min assist. SLP Short Term Goal 1 - Progress: Progressing toward goal SLP Short Term Goal 2: Pt will complete moderately complex thought organization tasks with 90% acc and min assist. SLP Short Term Goal 2 - Progress: Progressing toward goal SLP Short Term Goal 3: Pt will complete functional alternating and divided attention tasks in therapy setting with 90% acc and min assist. SLP Short Term Goal 3 - Progress: Progressing toward goal SLP Long Term Goals SLP Long Term Goal 1: Improve overall cognitive/executive function skills to [facility] for home and community environment with use of strategies as needed. SLP Long Term Goal 1 - Progress: Progressing toward goal  Assessment/Plan  Patient Active Problem List   Diagnosis Date Noted  . 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 Other general symptoms 01/06/2013  . IRRITABLE BOWEL SYNDROME 12/02/2010   SLP - End of Session Activity Tolerance: Patient tolerated  treatment well;Patient limited by fatigue (pt rested with head down due to dizziness) General Cognition: WFL for tasks performed  SLP Assessment/Plan Clinical Impression Statement: Patricia [name] has not been seen for several weeks because she was undergoing radiation treatment (which is now complete after 6 doses). She reports that while she is still dizzy and nauseous at times, on the whole it is getting better. She completed a visual attention exercise to spot 7 differences in two similar photographs and she was able to identify 5/7 independently. During a deductive reasoning puzzle, she needed moderate cues to slow down and complete one sentence/clue before moving down the list. Impulsivity was prevalent throughout the task, but husband continues to stated that she has always done everything quickly. He also says that she has been balancing check books at home and has done fine with it. She was given another deduction puzzle to complete for homework. I initially recommended only 2 visits, however I am now recommending an additional 2x/week for 2 weeks to address impulsivity in complex deduction and attention tasks. Speech Therapy Frequency: min 2x/week Duration: 2 weeks Treatment/Interventions: Cognitive reorganization;Compensatory strategies;Internal/external aids;Patient/family education;SLP instruction and feedback Potential to Achieve Goals: Good Potential Considerations:  (dizziness, ongoing radiation therapy to brain)  Thank you,  Havery Moros, CCC-SLP [phone]     PORTER,DABNEY 01/27/2013, 10:29 AM

## 2013-01-27 NOTE — Evaluation (Signed)
Physical Therapy Evaluation  Patient Details  Name: Patricia [name] MRN: [id number] Date of Birth: 1951-01-13  Today's Date: 01/27/2013 Time: 0855-0940 PT Time Calculation (min): 45 min Charges: 1 eval              Visit#: 1 of 8  Re-eval: 02/26/13 Assessment Diagnosis: Cerebellar lesion from melonma removal Surgical Date: 12/19/12 Next MD Visit: Dr. Wilson Singer Prior Therapy: Inpatient 1 week  Subjective Symptoms/Limitations Pertinent History: Pt is referred to PT s/p brain surgery to remove melonma.  During her last surgery she had a tumor on her cerebellum which is now causing her to have balance dysfuction.  She attended inpatient rehab for 1 week, came home on 01/04/13.  She has had 6 radiation treatments.  She is meeting with her oncologist on May 6th to discuss options for her further treatment..  C/o is feeling like a bobble head and cannot get her balance together.  She has significant help at home, feel unstable with her RW.  PLOF was independent with all activities, driving, walking up and down steps. She feels like her safest place is when she lays down. Denies dipolpia or loss of bowel and bladder control.  Patient Stated Goals: wants to work on improving balance so she can be independent with her ADL's Pain Assessment Currently in Pain?: No/denies  Precautions/Restrictions  Precautions Precautions: Fall  Balance Screening Balance Screen Has the patient fallen in the past 6 months: Yes How many times?: 1x Has the patient had a decrease in activity level because of a fear of falling? : Yes Is the patient reluctant to leave their home because of a fear of falling? : Yes  Prior Functioning  Home Living Lives With: Spouse;Family Available Help at Discharge: Friend(s);Family Type of Home: House Home Access: Stairs to enter [employer] of Steps: 3 Prior Function Vocation: Part time employment Vocation Requirements: work as accounts payable Comments: [employer]  spending time time with her 8 grandchildren  Cognition/Observation Observation/Other Assessments Observations: displays signs of central nervous system dizziness with nasuea, nystagmus, impulsivity  Mobility/Balance  Ambulation/Gait Ambulation/Gait: Yes Ambulation Distance (Feet): 200 Feet Assistive device: Rolling walker Gait Pattern: Shuffle (small quick steps) Berg Balance Test Sit to Stand: Able to stand without using hands and stabilize independently Standing Unsupported: Able to stand 2 minutes with supervision Sitting with Back Unsupported but Feet Supported on Floor or Stool: Able to sit safely and securely 2 minutes Stand to Sit: Controls descent by using hands Transfers: Able to transfer safely, definite need of hands Standing Unsupported with Eyes Closed: Able to stand 10 seconds with supervision Standing Ubsupported with Feet Together: Needs help to attain position but able to stand for 30 seconds with feet together From Standing, Reach Forward with Outstretched Arm: Can reach confidently >25 cm (10") From Standing Position, Pick up Object from Floor: Able to pick up shoe safely and easily From Standing Position, Turn to Look Behind Over each Shoulder: Looks behind one side only/other side shows less weight shift Turn 360 Degrees: Able to turn 360 degrees safely one side only in 4 seconds or less Standing Unsupported, Alternately Place Feet on Step/Stool: Able to complete 4 steps without aid or supervision Standing Unsupported, One Foot in Front: Loses balance while stepping or standing Standing on One Leg: Unable to try or needs assist to prevent fall Total Score: 37 Timed Up and Go Test TUG: Normal TUG Normal TUG (seconds): 19     Physical Therapy Assessment and Plan PT Assessment and Plan  Clinical Impression Statement: Pt is a 62 year old female referred to PT for balance disorder and difficulty walking from a central lesion to cerebellum from melonoma.  At this time  pt continues to have signs of central lesions.  She is very impulsive (pt and family report this was her PLOF).  At this time will continue to follow with PT to educate and improve overall balance and gait to improve her QOL.  Pt will benefit from skilled therapeutic intervention in order to improve on the following deficits: Abnormal gait;Difficulty walking;Decreased balance Rehab Potential: Fair Clinical Impairments Affecting Rehab Potential: secondary to central lesion causing balance disorder.  PT Frequency: Min 2X/week PT Duration: 6 weeks PT Treatment/Interventions: DME instruction;Gait training;Stair training;Functional mobility training;Therapeutic activities;Therapeutic exercise;Balance training;Neuromuscular re-education;Patient/family education PT Plan: Complete DGI.  Continue with stair training, cone rotation, numbers exercise, rocker borad and gaze stabalization.  Continue to progress to reach functional goals including ambulaiting with LRAD.    Goals Home Exercise Program Pt will Perform Home Exercise Program: Independently PT Goal: Perform Home Exercise Program - Progress: Goal set today PT Short Term Goals Time to Complete Short Term Goals: 3 weeks PT Short Term Goal 1: Pt will improve her body awareness and demonstrate rhomberg stance with eyes closed x1 minute.  PT Short Term Goal 2: Pt will improve her body awareness in order to have greater comfort with transfering to the toliet for improved QOL.  PT Short Term Goal 3: Pt will improve her dynamic balance and beign ambulating with a SPC for improved QOL.  PT Long Term Goals Time to Complete Long Term Goals:  (6 weeks) PT Long Term Goal 1: Pt will improve her berg balance scale to 45/56 in order to have greater independence at home.  PT Long Term Goal 2: Pt will improve her gait speed and ambulate 500 feet in 2 minutes for appropriate age.  Long Term Goal 3: Pt will improve her TUG time to 13 seconds for improved safety and  gait speed in the community with LRAD.  Long Term Goal 4: Pt will improve functional strength in order to ascend and descend 3 steps with min A for safe entry into her house.   Problem List Patient Active Problem List   Diagnosis Date Noted  . Balance disorder 01/27/2013  . Difficulty in walking 01/27/2013  . 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 Other general symptoms 01/06/2013  . IRRITABLE BOWEL SYNDROME 12/02/2010   PT - End of Session Activity Tolerance: Patient tolerated treatment well;Patient limited by fatigue (pt rested with head down due to dizziness) General Cognition: WFL for tasks performed PT Plan of Care PT Home Exercise Plan: see scanned report PT Patient Instructions: HEP to complete when she is sick, HEP when she is well, encouraged to continue with movement.  Consulted and Agree with Plan of Care: Patient;Family member/caregiver Family Member Consulted: husband and sister in Social worker.  Maudene Stotler, MPT, ATC 01/27/2013, 11:48 AM  Physician Documentation Your signature is required to indicate approval of the treatment plan as stated above.  Please sign and either send electronically or make a copy of this report for your files and return this physician signed original.   Please mark one 1.__approve of plan  2. ___approve of plan with the following conditions.   ______________________________  _____________________ Physician Signature                                                                                                             Date

## 2013-01-31 ENCOUNTER — Ambulatory Visit (HOSPITAL_COMMUNITY)
Admission: RE | Admit: 2013-01-31 | Discharge: 2013-01-31 | Disposition: A | Discharge status: Home / Self Care | Payer: 59 | Source: Ambulatory Visit | Attending: Rehabilitation | Admitting: Rehabilitation

## 2013-01-31 NOTE — Progress Notes (Signed)
Speech Language Pathology Treatment Patient Details  Name: Patricia [name] MRN: [id number] Date of Birth: Apr 08, 1951  Today's Date: 01/31/2013 Time: 1345-1430 SLP Time Calculation (min): 45 min  Authorization: Generic Commercial  Authorization Time Period:    Authorization Visit#:  3 of     HPI:  Symptoms/Limitations Symptoms: "I'm gonna be sick!" Pain Assessment Currently in Pain?: No/denies    Treatment  Cognitive-Linguistic Therapy Patient/Family Education Home Exercise Program  SLP Goals  Home Exercise SLP Goal: Patient will Perform Home Exercise Program: Independently SLP Goal: Perform Home Exercise Program - Progress: Progressing toward goal SLP Short Term Goals SLP Short Term Goal 1: Pt will utilize memory strategies in functional complex tasks for 95% recall and min assist. SLP Short Term Goal 1 - Progress: Progressing toward goal SLP Short Term Goal 2: Pt will complete moderately complex thought organization tasks with 90% acc and min assist. SLP Short Term Goal 2 - Progress: Progressing toward goal SLP Short Term Goal 3: Pt will complete functional alternating and divided attention tasks in therapy setting with 90% acc and min assist. SLP Short Term Goal 3 - Progress: Progressing toward goal SLP Long Term Goals SLP Long Term Goal 1: Improve overall cognitive/executive function skills to [facility] for home and community environment with use of strategies as needed. SLP Long Term Goal 1 - Progress: Progressing toward goal  Assessment/Plan  Patient Active Problem List   Diagnosis Date Noted  . Balance disorder 01/27/2013  . Difficulty in walking 01/27/2013  . 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 Other general symptoms 01/06/2013  . IRRITABLE BOWEL SYNDROME 12/02/2010   SLP - End of Session Activity Tolerance: Patient tolerated treatment well;Patient limited by fatigue (pt rested with head down due to dizziness) General Cognition: WFL for tasks performed  SLP  Assessment/Plan Clinical Impression Statement: Patricia [name] was accompanied by her husband today. She had to go to the ER last Thursday because of severe pain from a kidney stone. She has been more nauseous since then, especially today. It took her several minutes to settle/calm in the beginning of the session because of dizziness. I encouraged her to utliize breathing strategies.  She completed working memory tasks with 100% acc for recognition and 70% acc for recall tasks. In a written planning and problem solving task (Scheduling Talent Show), she made several errors due to impulsivity. She started filling out schedule before reading through entire text. She was given strategies for a deduction puzzle task, but she chose to complete without them (with good accuracy but required more time). Homework provided. Speech Therapy Frequency: min 2x/week Duration: 2 weeks Treatment/Interventions: Cognitive reorganization;Compensatory strategies;Internal/external aids;Patient/family education;SLP instruction and feedback Potential to Achieve Goals: Good Potential Considerations:  (dizziness, ongoing radiation therapy to brain)  Thank you,  Havery Moros, CCC-SLP [phone]     Patricia [name] 01/31/2013, 2:40 PM

## 2013-02-03 ENCOUNTER — Ambulatory Visit (HOSPITAL_COMMUNITY): Payer: PRIVATE HEALTH INSURANCE | Admitting: Speech Pathology

## 2013-02-07 ENCOUNTER — Ambulatory Visit (HOSPITAL_COMMUNITY)
Admission: RE | Admit: 2013-02-07 | Discharge: 2013-02-07 | Disposition: A | Discharge status: Home / Self Care | Payer: 59 | Source: Ambulatory Visit | Attending: Rehabilitation | Admitting: Rehabilitation

## 2013-02-07 NOTE — Progress Notes (Signed)
Physical Therapy Treatment Patient Details  Name: Patricia [name] MRN: [id number] Date of Birth: 02-25-1951  Today's Date: 02/07/2013 Time: 1110-1140 PT Time Calculation (min): 30 min  Visit#: 2 of 8  Re-eval: 02/26/13 Charges: NMR x 25'   Subjective: Symptoms/Limitations Symptoms: Pt states that she is walking very little at home, usually only to go to the bathroom. Pt reports HEP compliance. Pain Assessment Currently in Pain?: No/denies  Exercise/Treatments Balance Exercises Standing Standing Eyes Opened: 1 rep;30 secs Standing Eyes Closed: 1 rep;30 secs Tandem Stance: 2 reps;10 secs Wall Bumps: 5 reps;Hips Heel Raises: 10 reps Toe Raise: 10 reps Other Standing Exercises: Rocker board    Physical Therapy Assessment and Plan PT Assessment and Plan Clinical Impression Statement: Pt is very cooperative and motivated to improve. Completed DGI and began other standing NMR activities. Pt eventually became too dizzy to continue and needed to lie down to decrease dizziness.  Pt will benefit from skilled therapeutic intervention in order to improve on the following deficits: Abnormal gait;Difficulty walking;Decreased balance Rehab Potential: Fair PT Frequency: Min 2X/week PT Duration: 6 weeks PT Treatment/Interventions: DME instruction;Gait training;Stair training;Functional mobility training;Therapeutic activities;Therapeutic exercise;Balance training;Neuromuscular re-education;Patient/family education PT Plan: Continue with NMR activities as tolerated.      Problem List Patient Active Problem List   Diagnosis Date Noted  . Balance disorder 01/27/2013  . Difficulty in walking 01/27/2013  . 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 Other general symptoms 01/06/2013  . IRRITABLE BOWEL SYNDROME 12/02/2010    PT - End of Session Activity Tolerance: Other (comment) (Treatment limited by dizziness) General Behavior During Therapy: [facility] for tasks assessed/performed Cognition: [facility]  for tasks performed  Seth Bake, PTA  02/07/2013, 11:59 AM

## 2013-02-07 NOTE — Progress Notes (Signed)
Speech Language Pathology Treatment Patient Details  Name: Patricia [name] MRN: [id number] Date of Birth: [birth year]  Today's Date: 02/07/2013 Time:  -     Authorization: Generic Commercial  Authorization Time Period:    Authorization Visit#:  4 of     HPI:  Symptoms/Limitations Symptoms: She is feeling much better than when last seen. Pain Assessment Currently in Pain?: No/denies    Treatment  Cognitive-Linguistic Therapy Patient/Family Education Home Exercise Program  SLP Goals  Home Exercise SLP Goal: Patient will Perform Home Exercise Program: Independently SLP Short Term Goals SLP Short Term Goal 1: Pt will utilize memory strategies in functional complex tasks for 95% recall and min assist. SLP Short Term Goal 1 - Progress: Progressing toward goal SLP Short Term Goal 2: Pt will complete moderately complex thought organization tasks with 90% acc and min assist. SLP Short Term Goal 2 - Progress: Progressing toward goal SLP Short Term Goal 3: Pt will complete functional alternating and divided attention tasks in therapy setting with 90% acc and min assist. SLP Short Term Goal 3 - Progress: Progressing toward goal SLP Long Term Goals SLP Long Term Goal 1: Improve overall cognitive/executive function skills to [facility] for home and community environment with use of strategies as needed. SLP Long Term Goal 1 - Progress: Progressing toward goal  Assessment/Plan  Patient Active Problem List   Diagnosis Date Noted  . Balance disorder 01/27/2013  . Difficulty in walking 01/27/2013  . 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 Other general symptoms 01/06/2013  . IRRITABLE BOWEL SYNDROME 12/02/2010   SLP - End of Session Activity Tolerance: Patient tolerated treatment well;Patient limited by fatigue (pt rested with head down due to dizziness) General Behavior During Therapy: [facility] for tasks assessed/performed Cognition: WFL for tasks performed  SLP Assessment/Plan Clinical  Impression Statement: Pt was accompanied by her husband and reports significant decrease in nausea and MD decreased her medications which seems to have helped. Homework was not completed because she said it was too difficult. We completed together in session with mod cues (Setting table for Thanksgiving). While her memory skills appear strong, she has difficulty with attention to detail and continues to show impulsivity. Her husband acknowledges that this is a change for her, however the pt does not seem to be aware of changes (only to dizziness and balance). She needed cues to complete one step before moving on to the next and cues to create sublist for strategy to complete. Speech Therapy Frequency: min 2x/week Duration: 2 weeks Treatment/Interventions: Cognitive reorganization;Compensatory strategies;Internal/external aids;Patient/family education;SLP instruction and feedback Potential to Achieve Goals: Good Potential Considerations:  (dizziness, ongoing radiation therapy to brain)     Ankush Gintz 02/07/2013, 12:27 PM

## 2013-02-10 ENCOUNTER — Ambulatory Visit (HOSPITAL_COMMUNITY)
Admission: RE | Admit: 2013-02-10 | Discharge: 2013-02-10 | Disposition: A | Discharge status: Home / Self Care | Payer: 59 | Source: Ambulatory Visit | Attending: Physical Medicine and Rehabilitation | Admitting: Physical Medicine and Rehabilitation

## 2013-02-10 NOTE — Progress Notes (Signed)
Physical Therapy Treatment Patient Details  Name: PRINCESA WILLIG MRN: [id number] Date of Birth: 05-Apr-1951  Today's Date: 02/10/2013 Time: 1355-1425 PT Time Calculation (min): 30 min Visit#: 3 of 8  Re-eval: 02/26/13 Charges:  NMR 23'  Subjective: Symptoms/Limitations Symptoms: Pt states she's continued to get dizzy and it comes and goes. Pain Assessment Currently in Pain?: No/denies   Exercise/Treatments Balance Exercises Standing Tandem Gait: 1 rep Retro Gait: 1 rep Sidestepping: 1 rep Cone Rotation: R/L;Foam;Limitations Cone Rotation Limitations: HOLD for now Heel Raises: 10 reps Toe Raise: 10 reps Other Standing Exercises: Rocker board  Rt/Lt, A/P    Physical Therapy Assessment and Plan PT Assessment and Plan Clinical Impression Statement: Pt. required 3 rest breaks due to dizziness, one of which she laid sidelying to decrease dizziness.  Rotational movements tend to increase dizziness so will need to avoid these activities.  Able to complete all other activities with min to mod assist.  No nausea occured today, however pt request to be done early due to increasing dizziness. PT Plan: Continue with NMR activities as tolerated.      Problem List Patient Active Problem List   Diagnosis Date Noted  . Balance disorder 01/27/2013  . Difficulty in walking 01/27/2013  . 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 Other general symptoms 01/06/2013  . IRRITABLE BOWEL SYNDROME 12/02/2010    PT - End of Session Activity Tolerance: Other (comment) (Treatment limited by dizziness) General Behavior During Therapy: [facility] for tasks assessed/performed Cognition: WFL for tasks performed   Lurena Nida, PTA/CLT 02/10/2013, 2:35 PM

## 2013-02-11 NOTE — Progress Notes (Signed)
Speech Language Pathology Treatment Patient Details  Name: Patricia [name] MRN: [id number] Date of Birth: [birth year]  Today's Date: 02/10/2013 Time: 1300-1345 SLP Time Calculation (min): 45 min  Authorization: Generic Commercial  Authorization Time Period:    Authorization Visit#:  5 of   8   Treatment  Cognitive-Linguistic Therapy Patient/Family Education Home Exercise Program  SLP Goals  Home Exercise SLP Goal: Patient will Perform Home Exercise Program: Independently SLP Short Term Goals SLP Short Term Goal 1: Pt will utilize memory strategies in functional complex tasks for 95% recall and min assist. SLP Short Term Goal 1 - Progress: Progressing toward goal SLP Short Term Goal 2: Pt will complete moderately complex thought organization tasks with 90% acc and min assist. SLP Short Term Goal 2 - Progress: Progressing toward goal SLP Short Term Goal 3: Pt will complete functional alternating and divided attention tasks in therapy setting with 90% acc and min assist. SLP Short Term Goal 3 - Progress: Progressing toward goal SLP Long Term Goals SLP Long Term Goal 1: Improve overall cognitive/executive function skills to [facility] for home and community environment with use of strategies as needed. SLP Long Term Goal 1 - Progress: Progressing toward goal  Assessment/Plan  Patient Active Problem List   Diagnosis Date Noted  . Balance disorder 01/27/2013  . Difficulty in walking 01/27/2013  . 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 Other general symptoms 01/06/2013  . IRRITABLE BOWEL SYNDROME 12/02/2010   SLP - End of Session Activity Tolerance: Patient tolerated treatment well;Patient limited by fatigue (pt rested with head down due to dizziness) General Cognition: WFL for tasks performed  SLP Assessment/Plan Clinical Impression Statement: Pt forgot to bring homework, but stated she worked on it. In session today, she completed the "Loading Furniture Truck" thought  organization/planning task with moderate cues. She specifically needs cues to slow down and read one sentence and stop before racing to the next. Without cueing, she would likely read the text over and over without  retention of info. She was given items written on pieces of paper so that she could physically manipulate the items instead of writing down and having to erase. Patricia [name] missed several details in the deduction task given and needed tactile cue (placing my hands over the paper) to stop her (was not listening to verbal cues).  Speech Therapy Frequency: min 2x/week Duration: 2 weeks Treatment/Interventions: Cognitive reorganization;Compensatory strategies;Internal/external aids;Patient/family education;SLP instruction and feedback Potential to Achieve Goals: Good Potential Considerations:  (dizziness, ongoing radiation therapy to brain)      Patricia [name] 02/10/2013, 1:49 PM

## 2013-02-14 ENCOUNTER — Ambulatory Visit (HOSPITAL_COMMUNITY): Payer: PRIVATE HEALTH INSURANCE | Admitting: Speech Pathology

## 2013-02-14 ENCOUNTER — Telehealth (HOSPITAL_COMMUNITY): Payer: Self-pay

## 2013-02-17 ENCOUNTER — Telehealth (HOSPITAL_COMMUNITY): Payer: Self-pay

## 2013-02-17 ENCOUNTER — Inpatient Hospital Stay (HOSPITAL_COMMUNITY)
Admission: RE | Admit: 2013-02-17 | Payer: PRIVATE HEALTH INSURANCE | Source: Ambulatory Visit | Admitting: Speech Pathology

## 2013-02-22 ENCOUNTER — Ambulatory Visit (HOSPITAL_COMMUNITY)
Admission: RE | Admit: 2013-02-22 | Discharge: 2013-02-22 | Disposition: A | Discharge status: Home / Self Care | Payer: 59 | Source: Ambulatory Visit | Attending: *Deleted | Admitting: *Deleted

## 2013-02-22 NOTE — Progress Notes (Signed)
Physical Therapy Treatment Patient Details  Name: Patricia [name] MRN: [id number] Date of Birth: 1951-08-08  Today's Date: 02/22/2013 Time: 1302-1346 PT Time Calculation (min): 44 min Visit#: 5 of 8  Charges:  NMR 77'   Subjective:  Pt states her husband is mad at her because she didn't want to come to therapy today.  Pt reports minimal dizziness at times today and no other pain or complaint.    Exercise/Treatments Balance Exercises Standing SLS: 2 reps;15 secs;Eyes open;Solid surface Standing, One Foot on a Step: 6 inch;Limitations Standing, One Foot on a Step Limitations: alternating toe taps without UE assistance Balance Beam: 2RT Tandem Gait: 1 rep Retro Gait: 1 rep Numbers 1-15: 1 rep;Balance Beam Other Standing Exercises: Rocker board  Rt/Lt, A/P Other Standing Exercises: ambulation 200' witout AD working on slower gait with increased stride     Physical Therapy Assessment and Plan PT Assessment and Plan Clinical Impression Statement: Pt able to tolerate more activity today and complete entire session.  Required 4 total rest breaks, 2 seated and 2 in sidelying due to diizziness.  Able to complete # task, however difficulty with number recall at times with increased time to complete task.  Pt able to complete max of 15" SLS without UE assist on each LE.  Pt expressed frustration at times due to dizziness.  No nausea reported today.  Pt encouraged to continue her HEP. PT Plan: Re-evaluate next visit     Problem List Patient Active Problem List   Diagnosis Date Noted  . Balance disorder 01/27/2013  . Difficulty in walking 01/27/2013  . 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 Other general symptoms 01/06/2013  . IRRITABLE BOWEL SYNDROME 12/02/2010    PT - End of Session Activity Tolerance: Patient tolerated treatment well;Patient limited by fatigue (pt rested with head down due to dizziness) General Cognition: WFL for tasks performed   Lurena Nida,  PTA/CLT 02/22/2013, 2:53 PM

## 2013-02-22 NOTE — Progress Notes (Signed)
Speech Language Pathology Treatment Patient Details  Name: Patricia [name] MRN: [id number] Date of Birth: 1951/07/19  Today's Date: 02/22/2013 Time:  - 2:30 PM    Authorization: Generic Commercial  Authorization Time Period:    Authorization Visit#:  6 of     HPI:  Symptoms/Limitations Symptoms: "I can't remember anything anymore!" Pain Assessment Currently in Pain?: No/denies  Treatment  Cognitive-Linguistic Therapy Patient/Family Education Home Exercise Program  SLP Goals  Home Exercise SLP Goal: Patient will Perform Home Exercise Program: Independently SLP Short Term Goals SLP Short Term Goal 1: Pt will utilize memory strategies in functional complex tasks for 95% recall and min assist. SLP Short Term Goal 1 - Progress: Progressing toward goal SLP Short Term Goal 2: Pt will complete moderately complex thought organization tasks with 90% acc and min assist. SLP Short Term Goal 2 - Progress: Progressing toward goal SLP Short Term Goal 3: Pt will complete functional alternating and divided attention tasks in therapy setting with 90% acc and min assist. SLP Short Term Goal 3 - Progress: Progressing toward goal SLP Long Term Goals SLP Long Term Goal 1: Improve overall cognitive/executive function skills to [facility] for home and community environment with use of strategies as needed. SLP Long Term Goal 1 - Progress: Progressing toward goal  Assessment/Plan  Patient Active Problem List   Diagnosis Date Noted  . Balance disorder 01/27/2013  . Difficulty in walking(719.7) 01/27/2013  . 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 Other general symptoms 01/06/2013  . IRRITABLE BOWEL SYNDROME 12/02/2010   SLP - End of Session Activity Tolerance: Patient tolerated treatment well;Patient limited by fatigue (pt rested with head down due to dizziness) General Cognition: WFL for tasks performed  SLP Assessment/Plan Clinical Impression Statement: Pt missed therapy last week due to not feeling  well. Her mother and daughter in law accompanied her today and confirm that pt is having a very hard time remembering things that she previously had no problem with. Pt continued to perseverate on this throughout the session and was tearful about the same. She was unable to complete serial 7s (from 100) which she was able to complete during the initial evaluation and recalled 2/10 items from cued recall list (previously 7/10 and 10/10). Question whether this change is due to recent radiation treatment to brain that was initiated and completed shortly after our initial evaluation. She goes back to see her doctor this Thursday and Daughter in law will also call her oncologist to notify her of changes in cognition. Speech Therapy Frequency: min 2x/week Duration: 2 weeks Treatment/Interventions: Cognitive reorganization;Compensatory strategies;Internal/external aids;Patient/family education;SLP instruction and feedback Potential to Achieve Goals: Good Potential Considerations:  (dizziness, ongoing radiation therapy to brain)  GN    Muaad Boehning 02/22/2013, 7:36 PM

## 2013-02-24 ENCOUNTER — Ambulatory Visit (HOSPITAL_COMMUNITY): Payer: PRIVATE HEALTH INSURANCE | Admitting: Speech Pathology

## 2013-02-24 ENCOUNTER — Ambulatory Visit (HOSPITAL_COMMUNITY): Payer: PRIVATE HEALTH INSURANCE

## 2013-02-28 ENCOUNTER — Telehealth (HOSPITAL_COMMUNITY): Payer: Self-pay

## 2013-03-01 ENCOUNTER — Ambulatory Visit (HOSPITAL_COMMUNITY): Payer: PRIVATE HEALTH INSURANCE | Admitting: Speech Pathology

## 2013-03-14 ENCOUNTER — Ambulatory Visit (HOSPITAL_COMMUNITY)
Admission: RE | Admit: 2013-03-14 | Discharge: 2013-03-14 | Disposition: A | Discharge status: Home / Self Care | Payer: 59 | Source: Ambulatory Visit | Attending: Rehabilitation | Admitting: Rehabilitation

## 2013-03-14 DIAGNOSIS — M6281 Muscle weakness (generalized): Secondary | ICD-10-CM | POA: Insufficient documentation

## 2013-03-14 DIAGNOSIS — Z5189 Encounter for other specified aftercare: Secondary | ICD-10-CM | POA: Insufficient documentation

## 2013-03-14 DIAGNOSIS — C714 Malignant neoplasm of occipital lobe: Secondary | ICD-10-CM | POA: Insufficient documentation

## 2013-03-14 DIAGNOSIS — G3184 Mild cognitive impairment, so stated: Secondary | ICD-10-CM | POA: Insufficient documentation

## 2013-03-14 DIAGNOSIS — R42 Dizziness and giddiness: Secondary | ICD-10-CM | POA: Insufficient documentation

## 2013-03-14 NOTE — Evaluation (Signed)
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 Wilson Singer - unscheduled  Subjective Symptoms/Limitations Symptoms: Pt and family presents today and states that she had an MRI on 5/29 with more findings of melonma, she has had several radiation treatments and was told her Short term memroy would be effected, at this time she reports that her memory is doing pretty well. She continues to have increased dizziness when she lets go of her husbands hand.  Requires min A when walking around the house and outdoors and when standing to get dressed, brush her hair or shower.  Pain Assessment Currently in Pain?: No/denies  Precautions/Restrictions  Precautions Precautions: Fall  Exercise/Treatments Mobility/Balance  Ambulation/Gait Ambulation/Gait: Yes Ambulation Distance (Feet): 275 Feet (w/min A in 2 minutes (was 200 w/RW)) Assistive device: None Gait Pattern: Decreased stride length;Decreased hip/knee flexion - right;Decreased hip/knee flexion - left (Right lateralpulsion) Berg Balance Test Sit to Stand: Able to stand without using hands and stabilize independently Standing Unsupported: Able to stand 2 minutes with supervision Sitting with Back Unsupported but Feet Supported on Floor or Stool: Able to sit safely and securely 2 minutes Stand to Sit: Sits safely with minimal use of hands Transfers: Able to transfer safely, definite need of hands Standing Unsupported with Eyes Closed: Able to stand 10 seconds safely Standing Ubsupported with Feet Together: Needs help to attain position but able to stand for 30 seconds with feet  together From Standing, Reach Forward with Outstretched Arm: Can reach confidently >25 cm (10") From Standing Position, Pick up Object from Floor: Able to pick up shoe safely and easily From Standing Position, Turn to Look Behind Over each Shoulder: Looks behind one side only/other side shows less weight shift Turn 360 Degrees: Able to turn 360 degrees safely in 4 seconds or less Standing Unsupported, Alternately Place Feet on Step/Stool: Needs assistance to keep from falling or unable to try Standing Unsupported, One Foot in Front: Loses balance while stepping or standing Standing on One Leg: Tries to lift leg/unable to hold 3 seconds but remains standing independently Total Score: 39 Timed Up and Go Test TUG: Normal TUG Normal TUG (seconds): 16 (w/min A (was 19 sec w/RW))    Standing Standing Eyes Opened: Wide (BOA);Solid surface;Limitations Standing Eyes Opened Limitations: shoulder flexion x20 reps w/min A Turning: Both;5 reps Marching: Solid surface;Hand held assist (HHA) 1;10 reps   Physical Therapy Assessment and Plan PT Assessment and Plan Clinical Impression Statement: Ms. Sassano has attended 6 OP PT visits in the past 6 weeks w/following findings: improved berg balance test, improved gait speed, requires min A with ambulation/HHA, continues to be limited by increased anxiety causing increased dizziness with static standing activities.  Improves with dynamic balance activities. decreased impulsivity Rehab Potential: Fair PT Frequency: Min 2X/week PT Treatment/Interventions: Gait training;Stair training;Functional mobility training;Therapeutic activities;Therapeutic exercise;Balance training;Neuromuscular re-education;Patient/family education PT Plan: Continue to improve static standing activities with UE motion and LE motion (marching, shoulder flexion, theraband, sidestepping, retro gait,     Goals Home Exercise Program Pt will Perform Home Exercise Program: Independently PT  Goal: Perform Home Exercise Program - Progress: Progressing toward goal PT Short Term Goals  Time to Complete Short Term Goals: 3 weeks PT Short Term Goal 1: Pt will improve her body awareness and demonstrate rhomberg stance with eyes closed x1 minute.  PT Short Term Goal 1 - Progress: Progressing toward goal PT Short Term Goal 2: Pt will improve her body awareness in order to have greater comfort with transfering to the toliet for improved QOL.  PT Short Term Goal 2 - Progress: Progressing toward goal PT Short Term Goal 3: Pt will improve her dynamic balance and beign ambulating with a SPC for improved QOL.  PT Short Term Goal 3 - Progress: Met PT Long Term Goals Time to Complete Long Term Goals:  (6 weeks) PT Long Term Goal 1: Pt will improve her berg balance scale to 45/56 in order to have greater independence at home.  PT Long Term Goal 1 - Progress: Progressing toward goal PT Long Term Goal 2: Pt will improve her gait speed and ambulate 500 feet in 2 minutes for appropriate age.  PT Long Term Goal 2 - Progress: Progressing toward goal Long Term Goal 3: Pt will improve her TUG time to 13 seconds for improved safety and gait speed in the community with LRAD.  Long Term Goal 3 Progress: Progressing toward goal Long Term Goal 4: Pt will improve functional strength in order to ascend and descend 3 steps with min A for safe entry into her house.  Long Term Goal 4 Progress: Progressing toward goal  Problem List Patient Active Problem List   Diagnosis Date Noted  . Balance disorder 01/27/2013  . Difficulty in walking(719.7) 01/27/2013  . 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 Other general symptoms 01/06/2013  . IRRITABLE BOWEL SYNDROME 12/02/2010    PT - End of Session Activity Tolerance: Patient tolerated treatment well;Patient limited by fatigue (pt rested with head down due to dizziness) General Cognition: WFL for tasks performed PT Plan of Care PT Patient Instructions: discussed with  patient and family about techniques to help with short term memory to decrease speed (writting on mirros and refridgerator), continue with as much independence as able.  Consulted and Agree with Plan of Care: Patient  GP    Annett Fabian, MPT, ATC  03/14/2013, 4:02 PM  Physician Documentation Your signature is required to indicate approval of the treatment plan as stated above.  Please sign and either send electronically or make a copy of this report for your files and return this physician signed original.   Please mark one 1.__approve of plan  2. ___approve of plan with the following conditions.   ______________________________                                                          _____________________ Physician Signature  Date  

## 2013-03-16 ENCOUNTER — Ambulatory Visit (HOSPITAL_COMMUNITY): Payer: PRIVATE HEALTH INSURANCE | Admitting: Physical Therapy

## 2013-03-17 ENCOUNTER — Ambulatory Visit (HOSPITAL_COMMUNITY)
Admission: RE | Admit: 2013-03-17 | Discharge: 2013-03-17 | Disposition: A | Discharge status: Home / Self Care | Payer: 59 | Source: Ambulatory Visit | Attending: Rehabilitation | Admitting: Rehabilitation

## 2013-03-17 NOTE — Progress Notes (Signed)
Physical Therapy Treatment Patient Details  Name: Patricia [name] MRN: [id number] Date of Birth: 15-Aug-1951  Today's Date: 03/17/2013 Time: 1430-1510 PT Time Calculation (min): 40 min Charge ;NMR 40 (1430-1510)  Visit#: 7 of 10  Re-eval: 04/11/13    Subjective: Symptoms/Limitations Symptoms: Pt stated she was dizzy, reported going walking outside with husband alot today. Pain Assessment Currently in Pain?: No/denies  Objective:  Exercise/Treatments Balance Exercises Standing Standing Eyes Opened: Wide (BOA);Other reps (comment);Limitations;Narrow base of support (BOS) Standing Eyes Opened Limitations: shoulder flexion x20 reps w/min A, pertubation Tandem Stance: Eyes open;2 reps;30 secs;Limitations Tandem Stance Limitations: tandem stance 2x 30" each foot on 6 in step Standing, One Foot on a Step: Eyes open;6 inch;2 reps;30 secs;Limitations Standing, One Foot on a Step Limitations: tandem stance Tandem Gait: 1 rep Retro Gait: 1 rep Sidestepping: 1 rep Marching: Solid surface;Hand held assist (HHA) 1;10 reps   Physical Therapy Assessment and Plan PT Assessment and Plan Clinical Impression Statement: Session focus on improving spatial awareness with static standing activities.  Pt continues to be limited by increased anxiety causing increased dizziness with static standing activiting requiring min assistance for LOB episodes.  Limited by fatigue, required multiple rest breaks. PT Plan: Continue to improve static standing activities with UE motion and LE motion (marching, shoulder flexion, theraband, sidestepping, retro gait,     Goals    Problem List Patient Active Problem List   Diagnosis Date Noted  . Balance disorder 01/27/2013  . Difficulty in walking(719.7) 01/27/2013  . 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 Other general symptoms 01/06/2013  . IRRITABLE BOWEL SYNDROME 12/02/2010    PT - End of Session Activity Tolerance: Patient limited by fatigue;Patient  tolerated treatment well General Behavior During Therapy: [facility] for tasks assessed/performed Cognition: WFL for tasks performed  GP    Juel Burrow 03/17/2013, 3:22 PM

## 2013-03-22 ENCOUNTER — Inpatient Hospital Stay (HOSPITAL_COMMUNITY): Admission: RE | Admit: 2013-03-22 | Payer: PRIVATE HEALTH INSURANCE | Source: Ambulatory Visit

## 2013-03-23 ENCOUNTER — Ambulatory Visit (HOSPITAL_COMMUNITY): Payer: PRIVATE HEALTH INSURANCE | Admitting: Speech Pathology

## 2013-03-24 ENCOUNTER — Inpatient Hospital Stay (HOSPITAL_COMMUNITY): Admission: RE | Admit: 2013-03-24 | Payer: PRIVATE HEALTH INSURANCE | Source: Ambulatory Visit

## 2013-03-24 ENCOUNTER — Telehealth (HOSPITAL_COMMUNITY): Payer: Self-pay

## 2013-03-25 ENCOUNTER — Ambulatory Visit (HOSPITAL_COMMUNITY): Payer: PRIVATE HEALTH INSURANCE | Admitting: Speech Pathology

## 2013-03-28 ENCOUNTER — Ambulatory Visit (HOSPITAL_COMMUNITY)
Admission: RE | Admit: 2013-03-28 | Discharge: 2013-03-28 | Disposition: A | Discharge status: Home / Self Care | Payer: 59 | Source: Ambulatory Visit | Attending: Rehabilitation | Admitting: Rehabilitation

## 2013-03-28 ENCOUNTER — Ambulatory Visit (HOSPITAL_COMMUNITY): Payer: PRIVATE HEALTH INSURANCE | Admitting: Physical Therapy

## 2013-03-28 ENCOUNTER — Ambulatory Visit (HOSPITAL_COMMUNITY): Payer: PRIVATE HEALTH INSURANCE | Admitting: Speech Pathology

## 2013-03-28 NOTE — Progress Notes (Signed)
Speech Language Pathology Treatment Patient Details  Name: Patricia [name] MRN: [id number] Date of Birth: [birth year]  Today's Date: 03/28/2013 Time: 1350-1435 SLP Time Calculation (min): 45 min  Authorization: [employer]  Authorization Time Period:    Authorization Visit#:  7 of  8  HPI:  Symptoms/Limitations Symptoms: "I'm doing bette with my numbers!" Pain Assessment Currently in Pain?: No/denies   Treatment  Cognitive-Linguistic Therapy Patient/Family Education Home Exercise Program  SLP Goals  Home Exercise SLP Goal: Patient will Perform Home Exercise Program: Independently SLP Goal: Perform Home Exercise Program - Progress: Progressing toward goal SLP Short Term Goals SLP Short Term Goal 1: Pt will utilize memory strategies in functional complex tasks for 95% recall and min assist. SLP Short Term Goal 1 - Progress: Met SLP Short Term Goal 2: Pt will complete moderately complex thought organization tasks with 90% acc and min assist. SLP Short Term Goal 2 - Progress: Partly met SLP Short Term Goal 3: Pt will complete functional alternating and divided attention tasks in therapy setting with 90% acc and min assist. SLP Short Term Goal 3 - Progress: Met SLP Long Term Goals SLP Long Term Goal 1: Improve overall cognitive/executive function skills to [facility] for home and community environment with use of strategies as needed. SLP Long Term Goal 1 - Progress: Partly met  Assessment/Plan  Patient Active Problem List   Diagnosis Date Noted  . Balance disorder 01/27/2013  . Difficulty in walking(719.7) 01/27/2013  . 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 Other general symptoms 01/06/2013  . IRRITABLE BOWEL SYNDROME 12/02/2010   SLP - End of Session Activity Tolerance: Patient tolerated treatment well;Patient limited by fatigue (pt rested with head down due to dizziness) General Cognition: WFL for tasks performed  SLP Assessment/Plan Clinical Impression Statement: Pt  completed additional radiation and feels her memory is better. She was able to accurately relay the events of the past couple of weeks. 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 She perseverates on not wanting to come to therapy and feeling nauseous. Her biggest fear is falling, but acknowledges this has not happened at PT. She continues to complete word searches at home, but is frustrated by deduction puzzles. She has partly met all goals in speech therapy and I do not think we will make much more progress given motivation. SLP will discuss with pt/husband and likely D/C after next visit. Speech Therapy Frequency: min 2x/week Duration: 1 week Treatment/Interventions: Cognitive reorganization;Compensatory strategies;Internal/external aids;Patient/family education;SLP instruction and feedback Potential to Achieve Goals: Good Potential Considerations:  (dizziness, ongoing radiation therapy to brain)     Claudio Mondry 03/28/2013, 6:26 PM

## 2013-03-28 NOTE — Progress Notes (Signed)
Physical Therapy Treatment Patient Details  Name: Patricia [name] MRN: [id number] Date of Birth: September 02, 1951  Today's Date: 03/28/2013 Time: 4098-1191 PT Time Calculation (min): 26 min Visit#: 8 of 10  Re-eval: 04/11/13 Charges:  NMR 25'  Subjective: Symptoms/Limitations Symptoms: Pt states she would rather go home than participate in therapy today.  Able to convince pt to stay.  Currently reports no dizziness or pain. Pain Assessment Currently in Pain?: No/denies  Exercise/Treatments Balance Exercises Standing Tandem Stance: Eyes open;2 reps;30 secs;Limitations Tandem Stance Limitations: tandem stance 2x 30" each foot on 6 in step SLS: 2 reps;15 secs;Eyes open;Solid surface Gait with Head Turns (Round Trips): gait no head turns without AD X 300 feet in department Tandem Gait: 1 rep Retro Gait: 1 rep Sidestepping: 1 rep Marching: Solid surface;Hand held assist (HHA) 1;10 reps Other Standing Exercises: toe tapping on 8" step 10 reps Other Standing Exercises: step ups lateral, fwd step ups/down combined Rt. LE 10 reps     Physical Therapy Assessment and Plan PT Assessment and Plan Clinical Impression Statement: Pt able to complete most all exercises without difficulty, only c/o dizziness with marching activity.  Pt denied need for rest break and insisted to continue to ghet done with session.  Minimal LOB today during session with max SLS of 19" on Lt LE and 12" on Rt LE.  Pt requires assistance to establish balance initially but then able to maintain without UE assist.  Pt completed most of exercises before c/o of increased dizziness and requesting to "go Home" and end session. PT Plan: Continue to improve static standing activities with UE motion and LE motion.  Add tband next visit.      Problem List Patient Active Problem List   Diagnosis Date Noted  . Balance disorder 01/27/2013  . Difficulty in walking(719.7) 01/27/2013  . 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 Other  general symptoms 01/06/2013  . IRRITABLE BOWEL SYNDROME 12/02/2010    PT - End of Session Activity Tolerance: Other (comment) (Pt limited by dizziness) General Cognition: WFL for tasks performed   Lurena Nida, PTA/CLT 03/28/2013, 3:23 PM

## 2013-03-31 ENCOUNTER — Ambulatory Visit (HOSPITAL_COMMUNITY): Payer: PRIVATE HEALTH INSURANCE | Admitting: Physical Therapy

## 2013-03-31 ENCOUNTER — Ambulatory Visit (HOSPITAL_COMMUNITY): Payer: PRIVATE HEALTH INSURANCE | Admitting: Speech Pathology

## 2013-06-16 ENCOUNTER — Other Ambulatory Visit (HOSPITAL_COMMUNITY): Payer: Self-pay | Admitting: *Deleted

## 2013-06-16 DIAGNOSIS — C7931 Secondary malignant neoplasm of brain: Secondary | ICD-10-CM

## 2013-06-16 DIAGNOSIS — C439 Malignant melanoma of skin, unspecified: Secondary | ICD-10-CM

## 2013-06-17 ENCOUNTER — Ambulatory Visit (HOSPITAL_COMMUNITY)
Admission: RE | Admit: 2013-06-17 | Discharge: 2013-06-17 | Disposition: A | Discharge status: Home / Self Care | Payer: 59 | Source: Ambulatory Visit | Attending: Rehabilitation | Admitting: Rehabilitation

## 2013-06-17 ENCOUNTER — Ambulatory Visit (HOSPITAL_COMMUNITY): Payer: PRIVATE HEALTH INSURANCE

## 2013-06-17 DIAGNOSIS — C439 Malignant melanoma of skin, unspecified: Secondary | ICD-10-CM

## 2013-06-17 DIAGNOSIS — C7931 Secondary malignant neoplasm of brain: Secondary | ICD-10-CM | POA: Insufficient documentation

## 2013-06-17 LAB — POCT I-STAT CREATININE: Creatinine, Ser: 0.5 mg/dL (ref 0.50–1.10)

## 2013-06-17 MED ORDER — GADOBENATE DIMEGLUMINE 529 MG/ML IV SOLN
14.0000 mL | Freq: Once | INTRAVENOUS | Status: AC | PRN
  Administered 2013-06-17: 14 mL via INTRAVENOUS

## 2013-06-23 ENCOUNTER — Other Ambulatory Visit (HOSPITAL_COMMUNITY): Payer: Self-pay | Admitting: *Deleted

## 2013-06-23 DIAGNOSIS — C439 Malignant melanoma of skin, unspecified: Secondary | ICD-10-CM

## 2013-06-27 ENCOUNTER — Emergency Department (HOSPITAL_COMMUNITY): Payer: 59

## 2013-06-27 ENCOUNTER — Emergency Department (HOSPITAL_COMMUNITY)
Admission: EM | Admit: 2013-06-27 | Discharge: 2013-06-29 | Disposition: E | Payer: 59 | Attending: Emergency Medicine | Admitting: Emergency Medicine

## 2013-06-27 ENCOUNTER — Encounter (HOSPITAL_COMMUNITY): Payer: Self-pay | Admitting: *Deleted

## 2013-06-27 DIAGNOSIS — C7931 Secondary malignant neoplasm of brain: Secondary | ICD-10-CM | POA: Insufficient documentation

## 2013-06-27 DIAGNOSIS — J9600 Acute respiratory failure, unspecified whether with hypoxia or hypercapnia: Secondary | ICD-10-CM

## 2013-06-27 DIAGNOSIS — E872 Acidosis, unspecified: Secondary | ICD-10-CM | POA: Insufficient documentation

## 2013-06-27 DIAGNOSIS — R570 Cardiogenic shock: Secondary | ICD-10-CM

## 2013-06-27 DIAGNOSIS — I469 Cardiac arrest, cause unspecified: Secondary | ICD-10-CM | POA: Insufficient documentation

## 2013-06-27 DIAGNOSIS — Z85820 Personal history of malignant melanoma of skin: Secondary | ICD-10-CM | POA: Insufficient documentation

## 2013-06-27 DIAGNOSIS — K922 Gastrointestinal hemorrhage, unspecified: Secondary | ICD-10-CM | POA: Insufficient documentation

## 2013-06-27 HISTORY — DX: Unspecified lump in unspecified breast: N63.0

## 2013-06-27 HISTORY — DX: Secondary malignant neoplasm of liver and intrahepatic bile duct: C78.7

## 2013-06-27 HISTORY — DX: Anxiety disorder, unspecified: F41.9

## 2013-06-27 HISTORY — DX: Secondary malignant neoplasm of brain: C79.31

## 2013-06-27 HISTORY — DX: Malignant (primary) neoplasm, unspecified: C80.1

## 2013-06-27 HISTORY — DX: Secondary malignant neoplasm of unspecified lung: C78.00

## 2013-06-27 LAB — COMPREHENSIVE METABOLIC PANEL
Alkaline Phosphatase: 246 U/L — ABNORMAL HIGH (ref 39–117)
BUN: 39 mg/dL — ABNORMAL HIGH (ref 6–23)
CO2: 8 mEq/L — CL (ref 19–32)
Calcium: 10.4 mg/dL (ref 8.4–10.5)
GFR calc Af Amer: 59 mL/min — ABNORMAL LOW (ref >90)
GFR calc non Af Amer: 51 mL/min — ABNORMAL LOW (ref >90)
Glucose, Bld: 367 mg/dL — ABNORMAL HIGH (ref 70–99)
Potassium: 6.1 mEq/L — ABNORMAL HIGH (ref 3.5–5.1)
Sodium: 137 mEq/L (ref 135–145)
Total Protein: 5.5 g/dL — ABNORMAL LOW (ref 6.0–8.3)

## 2013-06-27 LAB — URINALYSIS, ROUTINE W REFLEX MICROSCOPIC
Bilirubin Urine: NEGATIVE
Glucose, UA: NEGATIVE mg/dL
Hgb urine dipstick: NEGATIVE
Protein, ur: NEGATIVE mg/dL
Urobilinogen, UA: 1 mg/dL (ref 0.0–1.0)
pH: 5.5 (ref 5.0–8.0)

## 2013-06-27 LAB — URINE MICROSCOPIC-ADD ON

## 2013-06-27 LAB — CBC WITH DIFFERENTIAL/PLATELET
Basophils Relative: 1 % (ref 0–1)
Eosinophils Relative: 0 % (ref 0–5)
HCT: 42.7 % (ref 36.0–46.0)
Lymphocytes Relative: 28 % (ref 12–46)
Lymphs Abs: 8 10*3/uL — ABNORMAL HIGH (ref 0.7–4.0)
MCHC: 29.5 g/dL — ABNORMAL LOW (ref 30.0–36.0)
MCV: 97.5 fL (ref 78.0–100.0)
Neutrophils Relative %: 66 % (ref 43–77)
RDW: 14.8 % (ref 11.5–15.5)

## 2013-06-27 LAB — POCT I-STAT 3, ART BLOOD GAS (G3+)
Bicarbonate: 11.1 mEq/L — ABNORMAL LOW (ref 20.0–24.0)
Patient temperature: 34
pCO2 arterial: 58.1 mmHg (ref 35.0–45.0)
pH, Arterial: 6.865 — CL (ref 7.350–7.450)

## 2013-06-27 LAB — ABO/RH: ABO/RH(D): O POS

## 2013-06-27 LAB — PREPARE RBC (CROSSMATCH)

## 2013-06-27 LAB — PROTIME-INR: Prothrombin Time: 22.9 seconds — ABNORMAL HIGH (ref 11.6–15.2)

## 2013-06-27 LAB — POCT I-STAT TROPONIN I

## 2013-06-27 LAB — PATHOLOGIST SMEAR REVIEW

## 2013-06-27 MED ORDER — SODIUM BICARBONATE 8.4 % IV SOLN
INTRAVENOUS | Status: AC | PRN
  Administered 2013-06-27 (×5): 50 meq via INTRAVENOUS

## 2013-06-27 MED ORDER — MIDAZOLAM HCL 2 MG/2ML IJ SOLN: 2.0000 mg | INTRAMUSCULAR | Status: DC | PRN

## 2013-06-27 MED ORDER — SODIUM BICARBONATE 8.4 % IV SOLN
INTRAVENOUS | Status: AC
  Administered 2013-06-27: 50 meq via INTRAVENOUS
  Filled 2013-06-27: qty 50

## 2013-06-27 MED ORDER — SODIUM BICARBONATE 8.4 % IV SOLN
INTRAVENOUS | Status: AC
  Filled 2013-06-27: qty 50

## 2013-06-27 MED ORDER — SODIUM CHLORIDE 0.9 % IV BOLUS (SEPSIS)
1000.0000 mL | Freq: Once | INTRAVENOUS | Status: AC
  Administered 2013-06-27: 1000 mL via INTRAVENOUS

## 2013-06-27 MED ORDER — PANTOPRAZOLE SODIUM 40 MG IV SOLR: 40.0000 mg | Freq: Every day | INTRAVENOUS | Status: DC

## 2013-06-27 MED ORDER — VASOPRESSIN 20 UNIT/ML IJ SOLN
0.0400 [IU]/min | INTRAMUSCULAR | Status: DC
  Filled 2013-06-27: qty 2.5

## 2013-06-27 MED ORDER — SODIUM CHLORIDE 0.9 % IV SOLN
80.0000 mg | Freq: Once | INTRAVENOUS | Status: AC
  Administered 2013-06-27: 80 mg via INTRAVENOUS
  Filled 2013-06-27: qty 80

## 2013-06-27 MED ORDER — SODIUM BICARBONATE 8.4 % IV SOLN
INTRAVENOUS | Status: AC
  Administered 2013-06-27: 50 meq
  Filled 2013-06-27: qty 100

## 2013-06-27 MED ORDER — SODIUM CHLORIDE 0.9 % IV SOLN: 2.0000 ug/min | INTRAVENOUS | Status: DC

## 2013-06-27 MED ORDER — ATROPINE SULFATE 1 MG/ML IJ SOLN
INTRAMUSCULAR | Status: AC | PRN
  Administered 2013-06-27: 1 mg via INTRAVENOUS

## 2013-06-27 MED ORDER — SODIUM CHLORIDE 0.9 % IV SOLN
8.0000 mg/h | INTRAVENOUS | Status: DC
  Filled 2013-06-27 (×2): qty 80

## 2013-06-27 MED ORDER — EPINEPHRINE HCL 0.1 MG/ML IJ SOSY
PREFILLED_SYRINGE | INTRAMUSCULAR | Status: AC | PRN
  Administered 2013-06-27 (×2): 1 mg via INTRAVENOUS

## 2013-06-27 MED ORDER — HYDROCORTISONE SOD SUCCINATE 100 MG IJ SOLR: 50.0000 mg | Freq: Four times a day (QID) | INTRAMUSCULAR | Status: DC

## 2013-06-27 MED ORDER — HYDROCORTISONE SOD SUCCINATE 100 MG IJ SOLR
100.0000 mg | Freq: Once | INTRAMUSCULAR | Status: AC
  Administered 2013-06-27: 100 mg via INTRAVENOUS
  Filled 2013-06-27: qty 2

## 2013-06-27 MED ORDER — NOREPINEPHRINE BITARTRATE 1 MG/ML IJ SOLN
12.0000 ug/min | Freq: Once | INTRAVENOUS | Status: DC
  Administered 2013-06-27: 12 ug/min via INTRAVENOUS
  Filled 2013-06-27: qty 8

## 2013-06-27 MED ORDER — SODIUM BICARBONATE 8.4 % IV SOLN: INTRAVENOUS | Status: DC

## 2013-06-27 MED ORDER — VASOPRESSIN 20 UNIT/ML IJ SOLN
INTRAMUSCULAR | Status: AC | PRN
  Administered 2013-06-27: 40 [IU]

## 2013-06-27 MED ORDER — SODIUM BICARBONATE 8.4 % IV SOLN
INTRAVENOUS | Status: DC
  Filled 2013-06-27 (×2): qty 150

## 2013-06-27 MED ORDER — CALCIUM CHLORIDE 10 % IV SOLN
INTRAVENOUS | Status: AC | PRN
  Administered 2013-06-27: 1 g via INTRAVENOUS

## 2013-06-27 MED ORDER — SODIUM CHLORIDE 0.9 % IV BOLUS (SEPSIS): 1000.0000 mL | Freq: Once | INTRAVENOUS | Status: DC

## 2013-06-27 MED ORDER — SODIUM CHLORIDE 0.9 % IV SOLN: 250.0000 mL | INTRAVENOUS | Status: DC | PRN

## 2013-06-27 MED ORDER — FENTANYL CITRATE 0.05 MG/ML IJ SOLN: 25.0000 ug | INTRAMUSCULAR | Status: DC | PRN

## 2013-06-27 MED ORDER — SODIUM CHLORIDE 0.9 % IV SOLN: INTRAVENOUS | Status: DC

## 2013-06-27 MED ORDER — NOREPINEPHRINE BITARTRATE 1 MG/ML IJ SOLN: 2.0000 ug/min | Freq: Once | INTRAVENOUS | Status: DC

## 2013-06-27 MED FILL — Medication: Qty: 1 | Status: AC

## 2013-06-28 ENCOUNTER — Ambulatory Visit (HOSPITAL_COMMUNITY): Payer: PRIVATE HEALTH INSURANCE

## 2013-06-28 LAB — URINE CULTURE: Culture: NO GROWTH

## 2013-06-29 LAB — TYPE AND SCREEN
ABO/RH(D): O POS
Unit division: 0

## 2013-06-29 NOTE — Procedures (Signed)
Arterial Catheter Insertion Procedure Note Suzanne Kho [id number] [birth year]  Procedure: Insertion of Arterial Catheter  Indications: Blood pressure monitoring and Frequent blood sampling  Procedure Details Consent: Risks of procedure as well as the alternatives and risks of each were explained to the (patient/caregiver).  Consent for procedure obtained. Time Out: Verified patient identification, verified procedure, site/side was marked, verified correct patient position, special equipment/implants available, medications/allergies/relevent history reviewed, required imaging and test results available.  Performed  Maximum sterile technique was used including antiseptics, cap, gloves, gown, hand hygiene, mask and sheet. Skin prep: Chlorhexidine; local anesthetic administered 20 gauge catheter was inserted into right femoral artery using the Seldinger technique.  Evaluation Blood flow good; BP tracing good. Complications: No apparent complications.  U/S used in placement.  Brett Canales Minor ACNP Adolph Pollack PCCM Pager [phone] till 3 pm If no answer page [phone] 2013/07/22, 7:41 AM  Patient seen and examined, agree with above note.  I dictated the care and orders written for this patient under my direction.  Alyson Reedy, MD [phone]

## 2013-06-29 NOTE — Procedures (Signed)
Code Blue Note  Patient with PEA arrest again, ACLS protocol followed for 3 times with ROSC but patient became pressor dependent.  Please see note for family discussion.  Now DNR.  Alyson Reedy, M.D. [facility] Pulmonary/Critical Care Medicine. Pager: [phone]. After hours pager: [phone].

## 2013-06-29 NOTE — Progress Notes (Signed)
Chaplain responded to call to ED to meet family of Trauma C patient in waiting area. Escorted family to Consultation Room A. Acted as Print production planner between large family and ER medical team. Was present with family when care was stopped and patient expired. Chaplain provided compassionate presence, grief and emotional support, and hospitality services. Consulted with day shift chaplain to refer care of family. Family was appreciative.  Maurene Capes, Chaplain

## 2013-06-29 NOTE — ED Notes (Signed)
Critical care at bedside  

## 2013-06-29 NOTE — ED Notes (Signed)
Pt desating to 70's, PCCM advised and pt bagged up to 98%...continual oozing of BRB out of nares, ET tube and OG; family at bedside with PCCM

## 2013-06-29 NOTE — Procedures (Signed)
Central Venous Catheter Insertion Procedure Note Patricia [name] [id number] [birth year]  Procedure: Insertion of Central Venous Catheter Indications: Assessment of intravascular volume, Drug and/or fluid administration and Frequent blood sampling  Procedure Details Consent: Unable to obtain consent because of emergent medical necessity. Time Out: Verified patient identification, verified procedure, site/side was marked, verified correct patient position, special equipment/implants available, medications/allergies/relevent history reviewed, required imaging and test results available.  Performed  Maximum sterile technique was used including antiseptics, cap, gloves, gown, hand hygiene, mask and sheet. Skin prep: Chlorhexidine; local anesthetic administered A antimicrobial bonded/coated triple lumen catheter was placed in the left internal jugular vein using the Seldinger technique. Ultrasound guidance used.yes Catheter placed to 20 cm. Blood aspirated via all 3 ports and then flushed x 3. Line sutured x 2 and dressing applied.  Evaluation Blood flow good Complications: No apparent complications Patient did tolerate procedure well. Chest X-ray ordered to verify placement.  CXR: normal.  U/S used in placement.  Brett Canales Minor ACNP Adolph Pollack PCCM Pager [phone] till 3 pm If no answer page [phone] 2013-07-03, 7:40 AM  I was present and supervised the procedure.  Alyson Reedy, M.D. [facility] Pulmonary/Critical Care Medicine. Pager: [phone]. After hours pager: [phone].

## 2013-06-29 NOTE — ED Notes (Signed)
80 mg of protonix given IV push. Verified with Dr. Silverio Lay with giving it push. Dr. Silverio Lay ok with given it push

## 2013-06-29 NOTE — ED Notes (Signed)
X-ray at bedside

## 2013-06-29 NOTE — ED Notes (Addendum)
Levophed increased to 20 mcg/min.

## 2013-06-29 NOTE — Progress Notes (Signed)
Utilization review completed.  P.J. Caellum Mancil,RN,BSN Case Manager [phone]  

## 2013-06-29 NOTE — ED Notes (Signed)
Family at bedside with pt; PCCM aware of VS

## 2013-06-29 NOTE — Progress Notes (Signed)
Chaplain relieved on-call chaplain who responded to a death call.  Upon arrival, family was grieving.  Chaplain shared in prayer with the family.  Also, promoted communication sharing between family and staff, and offered hospitality.  Chaplain ended visit after escorting family to visitation room and knowing that their own pastor was present.  Follow up if needed or requested.     26-Jul-2013 0830  Clinical Encounter Type  Visited With Family  Visit Type Spiritual support;Death  Referral From Chaplain  Consult/Referral To Chaplain  Spiritual Encounters  Spiritual Needs Grief support;Emotional;Prayer  Stress Factors  Patient Stress Factors None identified  Family Stress Factors Loss  [facility] Sheral Apley    [phone]

## 2013-06-29 NOTE — ED Notes (Addendum)
Levophed increased to 30mcg/min

## 2013-06-29 NOTE — ED Notes (Addendum)
Levophed drip started at 0610 at 12 mcg/min

## 2013-06-29 NOTE — H&P (Signed)
PULMONARY  / CRITICAL CARE MEDICINE  Name: Patricia [name] MRN: [id number] DOB: 02/03/1951    ADMISSION DATE:  07-11-13   REFERRING MD :  EDP PRIMARY SERVICE:PCCM  CHIEF COMPLAINT:  Cardiac arrest  BRIEF PATIENT DESCRIPTION:  62 yo with metastatic melanoma with brain lesion, post chemo with poor prognosis arrives in [facility] ED 9-29  Post cardiac arrest with prolonged CPR. PCCM asked to assume care.  SIGNIFICANT EVENTS / STUDIES:  9-29 LCB  LINES / TUBES: 9-29 ott>> 9-29 l ij cvl>> 9-29 rt fem a line>>  CULTURES: 9-29 bc x 2 >> 9-29 sputum>> 9-29 uc>>  ANTIBIOTICS: none  HISTORY OF PRESENT ILLNESS:  62 yo with metastatic melanoma with brain lesion, post chemo with poor prognosis arrives in [facility] ED 9-29  Post cardiac arrest with prolonged CPR. PCCM asked to assume care.  PAST MEDICAL HISTORY :  No past medical history on file. No past surgical history on file. Prior to Admission medications   Not on File   Allergies not on file  FAMILY HISTORY:  No family history on file. SOCIAL HISTORY:  has no tobacco, alcohol, and drug history on file.  REVIEW OF SYSTEMS:  na    VITAL SIGNS: Temp:  [94.1 F (34.5 C)] 94.1 F (34.5 C) (09/29 0653) Pulse Rate:  [85-121] 89 (09/29 0731) Resp:  [12-28] 22 (09/29 0731) BP: (51-179)/(19-104) 106/81 mmHg (09/29 0730) SpO2:  [88 %-98 %] 91 % (09/29 0731) Arterial Line BP: (133)/(83) 133/83 mmHg (09/29 0731) FiO2 (%):  [100 %] 100 % (09/29 0730) HEMODYNAMICS:   VENTILATOR SETTINGS: Vent Mode:  [-] PRVC FiO2 (%):  [100 %] 100 % Set Rate:  [16 bmp-22 bmp] 22 bmp Vt Set:  [500 mL] 500 mL PEEP:  [5 cmH20-8 cmH20] 8 cmH20 Plateau Pressure:  [30 cmH20] 30 cmH20 INTAKE / OUTPUT: Intake/Output     09/28 0701 - 09/29 0700 09/29 0701 - 09/30 0700   Blood 300    Total Intake 300     Emesis/NG output  200   Total Output   200   Net +300 -200          PHYSICAL EXAMINATION: General: Frail white female in acute  distress. Neuro: unresponsive HEENT:  No jvd Cardiovascular:  hsd Lungs: coarse rhonchi Abdomen:  +bs Musculoskeletal:  intact Skin:  cool  LABS:  CBC Recent Labs     11-Jul-2013  0613  WBC  PENDING  HGB  12.6  HCT  42.7  PLT  PENDING   Coag's Recent Labs     2013-07-11  0613  INR  2.10*   BMET Recent Labs     2013/07/11  0613  NA  137  K  6.1*  CL  91*  CO2  8*  BUN  39*  CREATININE  1.15*  GLUCOSE  367*   Electrolytes Recent Labs     Jul 11, 2013  0613  CALCIUM  10.4   Sepsis Markers No results found for this basename: LACTICACIDVEN, PROCALCITON, O2SATVEN,  in the last 72 hours ABG Recent Labs     11-Jul-2013  0725  PHART  6.865*  PCO2ART  58.1*  PO2ART  100.0   Liver Enzymes Recent Labs     07/11/2013  0613  AST  695*  ALT  896*  ALKPHOS  246*  BILITOT  0.4  ALBUMIN  2.0*   Cardiac Enzymes No results found for this basename: TROPONINI, PROBNP,  in the last 72 hours Glucose No results found for this basename:  GLUCAP,  in the last 72 hours  Imaging Dg Chest Portable 1 View  07-24-13   CLINICAL DATA:  ETT repositioning.  EXAM: PORTABLE CHEST - 1 VIEW  COMPARISON:  Jul 24, 2013  FINDINGS: Endotracheal tube has been retracted and is now approximately 4 cm above the carina. Large right pleural effusion. Masslike opacities within the lungs bilaterally as described previously, unchanged. Heart is normal size.  Interval placement of left central line. This crosses the midline with the tip likely in the right innominate vein. No pneumothorax.  IMPRESSION: Interval retraction of the endotracheal tube which is now 4 cm above the carina. Left central line crosses the midline with the tip in the right innominate vein. No pneumothorax.   Electronically Signed   By: Charlett Nose M.D.   On: 2013/07/24 06:58   Dg Chest Portable 1 View  07/24/2013   CLINICAL DATA:  Cardiac arrest.  EXAM: PORTABLE CHEST - 1 VIEW  COMPARISON:  None.  FINDINGS: Endotracheal tube is at the level  of the carina, possibly just into the right mainstem bronchus. This could be retracted approximately 3 cm.  Large right pleural effusion, possibly loculated. There are masslike opacities within both lungs. Cannot exclude metastases. Recommend clinical correlation for history of cancer. Heart is normal size. No acute bony abnormality.  IMPRESSION: Endotracheal tube at the level of the carina or just into the right mainstem bronchus. Recommend retracting 3 cm.  Masslike opacities within the lungs bilaterally. Cannot exclude pulmonary nodules/metastases.  Large right pleural effusion which appears loculated laterally.   Electronically Signed   By: Charlett Nose M.D.   On: 07/24/2013 06:33       ASSESSMENT / PLAN:  PULMONARY A:Post intubation for code P:   See ordes  CARDIOVASCULAR A: Post arrest P:  Follows  enyzmes  RENAL A:  Hypercalmia P:   See orders  GASTROINTESTINAL A:  GI bleed P:   PPI  HEMATOLOGIC A: Hemglobin11 P:  May  INFECTIOUS A:  RO infection P:   Pan culture  ENDOCRINE A:  Adrenal suppresion P:   solucortef   NEUROLOGIC A:  Non responsive with known brain mets. P:   ? Ct ro head bleed  TODAY'S SUMMARY:  62 yo with metastatic melanoma with brain lesion, post chemo with poor prognosis arrives in [facility] ED 9-29  Post cardiac arrest with prolonged CPR. PCCM asked to assume care.  Brett Canales Minor ACNP Adolph Pollack PCCM Pager [phone] till 3 pm If no answer page [phone] Jul 24, 2013, 7:43 AM  Spoke with family on more than one occasion, decision was made to make patient a full NCB, no further escalation of care and likely withdrawal soon once the rest of the family arrives.  CC time 90 minutes.  Patient seen and examined, agree with above note.  I dictated the care and orders written for this patient under my direction.  Alyson Reedy, MD [phone]

## 2013-06-29 NOTE — ED Notes (Signed)
Hematoma noted to left IJ from central line; oozing blood; PCCM advised

## 2013-06-29 NOTE — ED Provider Notes (Addendum)
CSN: [id number]     Arrival date & time 07-03-13  0559 History   First MD Initiated Contact with Patient 07-03-13 [phone]     Chief Complaint  Patient presents with  . Cardiac Arrest   (Consider location/radiation/quality/duration/timing/severity/associated sxs/prior Treatment) The history is provided by the EMS personnel. The history is limited by the condition of the patient.  Rahma Meller is a 62 y.o. female hx of melanoma with mets to brain here with post cardiac arrest. She was feeling unwell for the last day. Last normal was around 4:50 AM. Husband called EMS and CPR was started. EMS was able to get pulse back after 2 rounds of epi. However, patient went back into PEA arrest afterwards. Patient intubated in the field.   Level V caveat- AMS, intubated  No past medical history on file. No past surgical history on file. No family history on file. History  Substance Use Topics  . Smoking status: Not on file  . Smokeless tobacco: Not on file  . Alcohol Use: Not on file   OB History   No data available     Review of Systems  Unable to perform ROS: Patient unresponsive    Allergies  Review of patient's allergies indicates not on file.  Home Medications  No current outpatient prescriptions on file. BP 64/38  Pulse 88  Temp(Src) 94.1 F (34.5 C) (Core (Comment))  Resp 21  SpO2 98% Physical Exam  Nursing note and vitals reviewed. Constitutional:  Intubated, CPR in progress   HENT:  Head: Atraumatic.  Eyes:  Equal and nonreactive   Neck: Normal range of motion. Neck supple.  Cardiovascular: Normal rate, regular rhythm and normal heart sounds.   Pulmonary/Chest:  Bilateral breath sounds   Abdominal:  Distended   Musculoskeletal: She exhibits no edema.  Neurological:  Intubated   Skin: Skin is dry.  Psychiatric:  Unable     ED Course  Procedures (including critical care time) Cardiopulmonary Resuscitation (CPR) Procedure Note Directed/Performed by: Chaney Malling I personally directed ancillary staff and/or performed CPR in an effort to regain return of spontaneous circulation and to maintain cardiac, neuro and systemic perfusion.   CRITICAL CARE Performed by: Silverio Lay, Pearson Reasons   Total critical care time: 45 min   Critical care time was exclusive of separately billable procedures and treating other patients.  Critical care was necessary to treat or prevent imminent or life-threatening deterioration.  Critical care was time spent personally by me on the following activities: development of treatment plan with patient and/or surrogate as well as nursing, discussions with consultants, evaluation of patient's response to treatment, examination of patient, obtaining history from patient or surrogate, ordering and performing treatments and interventions, ordering and review of laboratory studies, ordering and review of radiographic studies, pulse oximetry and re-evaluation of patient's condition.   Labs Review Labs Reviewed  COMPREHENSIVE METABOLIC PANEL - Abnormal; Notable for the following:    Potassium 6.1 (*)    Chloride 91 (*)    CO2 8 (*)    Glucose, Bld 367 (*)    BUN 39 (*)    Creatinine, Ser 1.15 (*)    Total Protein 5.5 (*)    Albumin 2.0 (*)    AST 695 (*)    ALT 896 (*)    Alkaline Phosphatase 246 (*)    GFR calc non Af Amer 51 (*)    GFR calc Af Amer 59 (*)    All other components within normal limits  PROTIME-INR - Abnormal; Notable  for the following:    Prothrombin Time 22.9 (*)    INR 2.10 (*)    All other components within normal limits  URINALYSIS, ROUTINE W REFLEX MICROSCOPIC - Abnormal; Notable for the following:    Color, Urine AMBER (*)    APPearance TURBID (*)    Leukocytes, UA TRACE (*)    All other components within normal limits  URINE MICROSCOPIC-ADD ON - Abnormal; Notable for the following:    Squamous Epithelial / LPF MANY (*)    Bacteria, UA FEW (*)    Casts HYALINE CASTS (*)    All other components within  normal limits  CG4 I-STAT (LACTIC ACID) - Abnormal; Notable for the following:    Lactic Acid, Venous 19.14 (*)    All other components within normal limits  URINE CULTURE  CULTURE, BLOOD (ROUTINE X 2)  CULTURE, BLOOD (ROUTINE X 2)  URINE CULTURE  CBC WITH DIFFERENTIAL  BLOOD GAS, ARTERIAL  POCT I-STAT TROPONIN I  TYPE AND SCREEN  PREPARE RBC (CROSSMATCH)  ABO/RH   Imaging Review Dg Chest Portable 1 View  07-15-2013   CLINICAL DATA:  ETT repositioning.  EXAM: PORTABLE CHEST - 1 VIEW  COMPARISON:  2013/07/15  FINDINGS: Endotracheal tube has been retracted and is now approximately 4 cm above the carina. Large right pleural effusion. Masslike opacities within the lungs bilaterally as described previously, unchanged. Heart is normal size.  Interval placement of left central line. This crosses the midline with the tip likely in the right innominate vein. No pneumothorax.  IMPRESSION: Interval retraction of the endotracheal tube which is now 4 cm above the carina. Left central line crosses the midline with the tip in the right innominate vein. No pneumothorax.   Electronically Signed   By: Charlett Nose M.D.   On: 07-15-13 06:58   Dg Chest Portable 1 View  07/15/13   CLINICAL DATA:  Cardiac arrest.  EXAM: PORTABLE CHEST - 1 VIEW  COMPARISON:  None.  FINDINGS: Endotracheal tube is at the level of the carina, possibly just into the right mainstem bronchus. This could be retracted approximately 3 cm.  Large right pleural effusion, possibly loculated. There are masslike opacities within both lungs. Cannot exclude metastases. Recommend clinical correlation for history of cancer. Heart is normal size. No acute bony abnormality.  IMPRESSION: Endotracheal tube at the level of the carina or just into the right mainstem bronchus. Recommend retracting 3 cm.  Masslike opacities within the lungs bilaterally. Cannot exclude pulmonary nodules/metastases.  Large right pleural effusion which appears loculated  laterally.   Electronically Signed   By: Charlett Nose M.D.   On: July 15, 2013 06:33     Date: July 15, 2013  Rate: 114  Rhythm: normal sinus rhythm  QRS Axis: normal  Intervals: normal  ST/T Wave abnormalities: nonspecific ST changes  Conduction Disutrbances:none  Narrative Interpretation:   Old EKG Reviewed: none available    MDM   1. Cardiac arrest   2. GI bleed   3. Lactic acidosis    Brooklyne Radke is a 62 y.o. female here with cardiac arrest. After 1 epi in the ED, was able to get pulse back. I started levophed drip. Patient has blood from NG tube so protonix ordered and 1 U O neg given. ICU called. I talked to family. As per husband, she had been on chemo and she didn't have a DNR. Husband wants full code for now.   7:06 AM Patient went back to cardiac arrest. CPR initiated and was able to  regain pulse. K 6.1. Calcium given. Bicarb given. Patient admitted to the ICU.     Richardean Canal, MD 07-03-2013 1610  Richardean Canal, MD 07-03-2013 [phone]

## 2013-06-29 DEATH — deceased

## 2015-03-19 IMAGING — CR DG CHEST 1V PORT
1 series · 1 of 1 positions shown · non-contrast
Comparison: 06/27/2013

CLINICAL DATA: ETT repositioning.

EXAM:
PORTABLE CHEST - 1 VIEW

[AP]
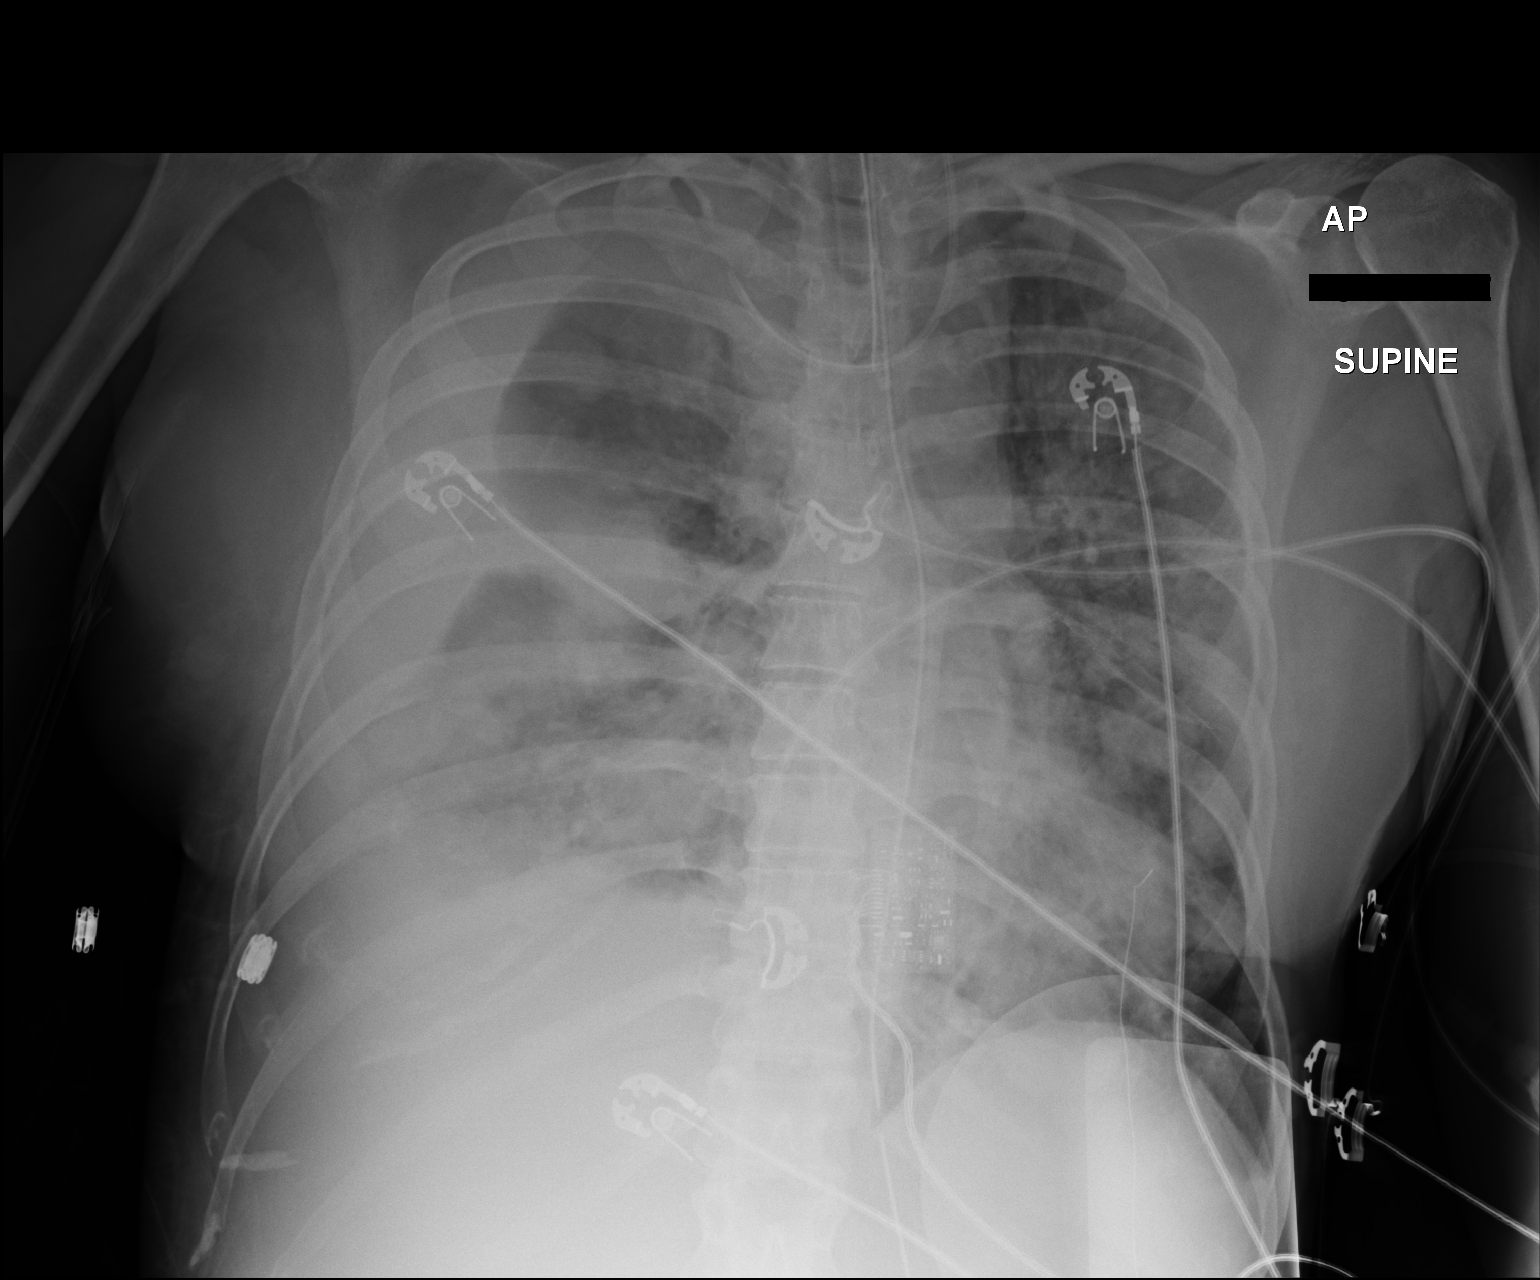

[1 of 1 positions shown; findings below may reference images not displayed]

FINDINGS: Endotracheal tube has been retracted and is now approximately 4 cm
above the carina. Large right pleural effusion. Masslike opacities
within the lungs bilaterally as described previously, unchanged.
Heart is normal size.

Interval placement of left central line. This crosses the midline
with the tip likely in the right innominate vein. No pneumothorax.
IMPRESSION: Interval retraction of the endotracheal tube which is now 4 cm above
the carina. Left central line crosses the midline with the tip in
the right innominate vein. No pneumothorax.
